# Patient Record
Sex: Female | Born: 1995 | Race: Black or African American | Hispanic: No | Marital: Single | Smoking: Former smoker
Health system: Southern US, Community
[De-identification: ages and names within clinical notes are randomized; demographics above are authoritative.]

## PROBLEM LIST (undated history)

## (undated) DIAGNOSIS — O1490 Unspecified pre-eclampsia, unspecified trimester: Secondary | ICD-10-CM

## (undated) HISTORY — PX: NO PAST SURGERIES: SHX2092

## (undated) HISTORY — DX: Unspecified pre-eclampsia, unspecified trimester: O14.90

---
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
## 2016-03-02 ENCOUNTER — Emergency Department (HOSPITAL_COMMUNITY): Admission: EM | Admit: 2016-03-02 | Discharge: 2016-03-02 | Discharge status: Against Medical Advice | Payer: Self-pay

## 2016-03-02 NOTE — ED Notes (Signed)
Called pt and unable to locate

## 2016-03-02 NOTE — ED Notes (Signed)
Pt. Came to the desk and stated, I have to go , I will come back later."  Encouraged pt. To stay, she stated, " I will come back when it is not busy."

## 2017-07-17 ENCOUNTER — Emergency Department (HOSPITAL_COMMUNITY)
Admission: EM | Admit: 2017-07-17 | Discharge: 2017-07-17 | Disposition: A | Discharge status: Home / Self Care | Payer: Medicaid Other | Attending: Emergency Medicine | Admitting: Emergency Medicine

## 2017-07-17 ENCOUNTER — Emergency Department (HOSPITAL_COMMUNITY): Payer: Medicaid Other

## 2017-07-17 ENCOUNTER — Encounter (HOSPITAL_COMMUNITY): Payer: Self-pay | Admitting: Emergency Medicine

## 2017-07-17 DIAGNOSIS — O468X1 Other antepartum hemorrhage, first trimester: Secondary | ICD-10-CM | POA: Insufficient documentation

## 2017-07-17 DIAGNOSIS — O4690 Antepartum hemorrhage, unspecified, unspecified trimester: Secondary | ICD-10-CM

## 2017-07-17 DIAGNOSIS — O418X11 Other specified disorders of amniotic fluid and membranes, first trimester, fetus 1: Secondary | ICD-10-CM | POA: Insufficient documentation

## 2017-07-17 DIAGNOSIS — Z87891 Personal history of nicotine dependence: Secondary | ICD-10-CM | POA: Insufficient documentation

## 2017-07-17 DIAGNOSIS — Z3A08 8 weeks gestation of pregnancy: Secondary | ICD-10-CM | POA: Insufficient documentation

## 2017-07-17 DIAGNOSIS — O418X10 Other specified disorders of amniotic fluid and membranes, first trimester, not applicable or unspecified: Secondary | ICD-10-CM

## 2017-07-17 DIAGNOSIS — O208 Other hemorrhage in early pregnancy: Secondary | ICD-10-CM | POA: Diagnosis present

## 2017-07-17 LAB — URINALYSIS, ROUTINE W REFLEX MICROSCOPIC
Bilirubin Urine: NEGATIVE
GLUCOSE, UA: NEGATIVE mg/dL
HGB URINE DIPSTICK: NEGATIVE
Ketones, ur: NEGATIVE mg/dL
LEUKOCYTES UA: NEGATIVE
Nitrite: NEGATIVE
PH: 7 (ref 5.0–8.0)
Protein, ur: NEGATIVE mg/dL
Specific Gravity, Urine: 1.021 (ref 1.005–1.030)

## 2017-07-17 LAB — HCG, QUANTITATIVE, PREGNANCY: hCG, Beta Chain, Quant, S: 127852 m[IU]/mL — ABNORMAL HIGH (ref <5)

## 2017-07-17 LAB — COMPREHENSIVE METABOLIC PANEL
ALK PHOS: 57 U/L (ref 38–126)
ALT: 35 U/L (ref 14–54)
ANION GAP: 9 (ref 5–15)
AST: 26 U/L (ref 15–41)
Albumin: 4.1 g/dL (ref 3.5–5.0)
BILIRUBIN TOTAL: 0.5 mg/dL (ref 0.3–1.2)
BUN: 10 mg/dL (ref 6–20)
CALCIUM: 9.6 mg/dL (ref 8.9–10.3)
CO2: 22 mmol/L (ref 22–32)
CREATININE: 0.51 mg/dL (ref 0.44–1.00)
Chloride: 107 mmol/L (ref 101–111)
GFR calc non Af Amer: >60 mL/min (ref >60)
Glucose, Bld: 92 mg/dL (ref 65–99)
Potassium: 3.3 mmol/L — ABNORMAL LOW (ref 3.5–5.1)
SODIUM: 138 mmol/L (ref 135–145)
TOTAL PROTEIN: 7.3 g/dL (ref 6.5–8.1)

## 2017-07-17 LAB — CBC
HEMATOCRIT: 41.2 % (ref 36.0–46.0)
HEMOGLOBIN: 14 g/dL (ref 12.0–15.0)
MCH: 28.2 pg (ref 26.0–34.0)
MCHC: 34 g/dL (ref 30.0–36.0)
MCV: 83.1 fL (ref 78.0–100.0)
Platelets: 235 10*3/uL (ref 150–400)
RBC: 4.96 MIL/uL (ref 3.87–5.11)
RDW: 13.4 % (ref 11.5–15.5)
WBC: 5.6 10*3/uL (ref 4.0–10.5)

## 2017-07-17 LAB — WET PREP, GENITAL
CLUE CELLS WET PREP: NONE SEEN
SPERM: NONE SEEN
Trich, Wet Prep: NONE SEEN
Yeast Wet Prep HPF POC: NONE SEEN

## 2017-07-17 LAB — ABO/RH: ABO/RH(D): O POS

## 2017-07-17 NOTE — ED Triage Notes (Signed)
Pt states she is [redacted] weeks pregnant and woke up this morning and when she wiped she had some pink fluid on the tissue and when she wiped again she had some bright red blood on the tissue  Pt states she has some mild cramping but denies pain

## 2017-07-17 NOTE — Discharge Instructions (Signed)
Please read attached information regarding your condition. Follow pelvic rest instructions as attached. Follow-up with women's health clinic for further evaluation. Return to ED for severe abdominal pain, increased bleeding, urinary symptoms, vaginal discharge, increased vomiting.

## 2017-07-17 NOTE — ED Notes (Signed)
Ultrasound at the bedside

## 2017-07-17 NOTE — ED Provider Notes (Signed)
[facility]-EMERGENCY DEPT Provider Note   CSN: [id number] Arrival date & time: 07/17/17  6962     History   Chief Complaint Chief Complaint  Patient presents with  . Vaginal Bleeding    HPI Wanda Porter is a 21 y.o. female.  HPI  Patient, who is [redacted]wks pregnant, presents to ED for evaluation of vaginal bleeding prior to arrival. She reports pink "fluidy" discharge and bright red blood while wiping. She has since then been using a tampon but has not checked if bleeding has continued. She reports intermittent abdominal cramping since beginning of pregnancy. She reports Being seen by her OB/GYN approximately 5 days ago for checkup. She reports she was told that she had a normal intrauterine pregnancy and baby had a heart rate of 140s at [facility]. Reports some nausea and occasional vomiting. She denies any chest pain, SOB, hemoptysis, constipation, diarrhea, vaginal discharge, STD exposure. She reports that this is her second pregnancy. She states that her first pregnancy was a few years ago and she received an abortion due to her young age.  History reviewed. No pertinent past medical history.  There are no active problems to display for this patient.   History reviewed. No pertinent surgical history.  OB History    Gravida Para Term Preterm AB Living   1             SAB TAB Ectopic Multiple Live Births                   Home Medications    Prior to Admission medications   Medication Sig Start Date End Date Taking? Authorizing Provider  Prenatal Vit-Fe Fumarate-FA (MULTIVITAMIN-PRENATAL) 27-0.8 MG TABS tablet Take 1 tablet by mouth daily at 12 noon.   Yes [provider]    Family History Family History  Problem Relation Age of Onset  . Diabetes Other   . Hypertension Other     Social History Social History  Substance Use Topics  . Smoking status: Former Games developer  . Smokeless tobacco: Never Used  . Alcohol use No     Allergies   Patient has no known  allergies.   Review of Systems Review of Systems  Constitutional: Negative for appetite change, chills and fever.  HENT: Negative for ear pain, rhinorrhea, sneezing and sore throat.   Eyes: Negative for photophobia and visual disturbance.  Respiratory: Negative for cough, chest tightness, shortness of breath and wheezing.   Cardiovascular: Negative for chest pain and palpitations.  Gastrointestinal: Positive for abdominal pain (cramping), nausea and vomiting. Negative for blood in stool, constipation and diarrhea.  Genitourinary: Positive for vaginal bleeding. Negative for dysuria, hematuria and urgency.  Musculoskeletal: Negative for myalgias.  Skin: Negative for rash.  Neurological: Negative for dizziness, weakness and light-headedness.     Physical Exam Updated Vital Signs BP 112/76   Pulse 81   Temp 98.3 F (36.8 C) (Oral)   Resp 18   Ht 5' (1.524 m)   Wt 54.5 kg (120 lb 4 oz)   LMP 05/17/2017 (Exact Date)   SpO2 100%   BMI 23.48 kg/m   Physical Exam  Constitutional: She appears well-developed and well-nourished. No distress.  HENT:  Head: Normocephalic and atraumatic.  Nose: Nose normal.  Eyes: Conjunctivae and EOM are normal. Left eye exhibits no discharge. No scleral icterus.  Neck: Normal range of motion. Neck supple.  Cardiovascular: Regular rhythm, normal heart sounds and intact distal pulses.  Tachycardia present.  Exam reveals no gallop  and no friction rub.   No murmur heard. Pulmonary/Chest: Effort normal and breath sounds normal. No respiratory distress.  Abdominal: Soft. Bowel sounds are normal. She exhibits no distension. There is no tenderness. There is no guarding.  Genitourinary: There is no rash on the right labia. There is no rash on the left labia. Cervix exhibits no motion tenderness and no discharge. Right adnexum displays no tenderness. Left adnexum displays no tenderness. Vaginal discharge found.  Genitourinary Comments: Chaperone present for  entirety of GU exam. There is thin white vaginal discharge present in the vaginal vault. No bleeding noted in vault. Cervical os is closed. No cervical motion tenderness or adnexal tenderness noted.  Musculoskeletal: Normal range of motion. She exhibits no edema.  Neurological: She is alert. She exhibits normal muscle tone. Coordination normal.  Skin: Skin is warm and dry. No rash noted.  Psychiatric: She has a normal mood and affect.  Nursing note and vitals reviewed.    ED Treatments / Results  Labs (all labs ordered are listed, but only abnormal results are displayed) Labs Reviewed  WET PREP, GENITAL - Abnormal; Notable for the following:       Result Value   WBC, Wet Prep HPF POC RARE (*)    All other components within normal limits  HCG, QUANTITATIVE, PREGNANCY - Abnormal; Notable for the following:    hCG, Beta Francene FindersChain, Quant, S [id number] (*)    All other components within normal limits  COMPREHENSIVE METABOLIC PANEL - Abnormal; Notable for the following:    Potassium 3.3 (*)    All other components within normal limits  CBC  URINALYSIS, ROUTINE W REFLEX MICROSCOPIC  ABO/RH  GC/CHLAMYDIA PROBE AMP ([place]) NOT AT [facility]    EKG  EKG Interpretation None       Radiology Kore[place] Ob Comp < 14 Wks  Result Date: 07/17/2017 CLINICAL DATA:  Vaginal bleeding. EXAM: OBSTETRIC <14 WK US AND TRANSVAGINAL OB US TECHNIQUE: Both transabdominal and transvaginal ultrasound examinations were performed for complete evaluation of the gestation as well as the maternal uterus, adnexal regions, and pelvic cul-de-sac. Transvaginal technique was performed to assess early pregnancy. COMPARISON:  No recent prior . FINDINGS: Intrauterine gestational sac: Single Yolk sac:  Present Embryo:  Present Cardiac Activity: Present Heart Rate: 152  bpm MSD:   mm    w     d CRL:  2.0  mm   8 w   4 d                  US EDC: 02/22/2018 Subchorionic hemorrhage:  Small subchorionic hemorrhage noted. Maternal  uterus/adnexae: Right ovarian cyst with thin septations measuring up to 2.4 cm noted. These most likely benign. Trace free pelvic fluid. IMPRESSION: Single viable intrauterine pregnancy at 8 weeks 4 days. Small subchorionic hemorrhage noted. Electronically Signed   By: Maisie Fushomas  Register   On: 07/17/2017 08:33   [place] Ob Transvaginal  Result Date: 07/17/2017 CLINICAL DATA:  Vaginal bleeding. EXAM: OBSTETRIC <14 WK US AND TRANSVAGINAL OB US TECHNIQUE: Both transabdominal and transvaginal ultrasound examinations were performed for complete evaluation of the gestation as well as the maternal uterus, adnexal regions, and pelvic cul-de-sac. Transvaginal technique was performed to assess early pregnancy. COMPARISON:  No recent prior . FINDINGS: Intrauterine gestational sac: Single Yolk sac:  Present Embryo:  Present Cardiac Activity: Present Heart Rate: 152  bpm MSD:   mm    w     d CRL:  2.0  mm   8  w   4 d                  US EDC: 02/22/2018 Subchorionic hemorrhage:  Small subchorionic hemorrhage noted. Maternal uterus/adnexae: Right ovarian cyst with thin septations measuring up to 2.4 cm noted. These most likely benign. Trace free pelvic fluid. IMPRESSION: Single viable intrauterine pregnancy at 8 weeks 4 days. Small subchorionic hemorrhage noted. Electronically Signed   By: Maisie Fushomas  Register   On: 07/17/2017 08:33    Procedures Procedures (including critical care time)  Medications Ordered in ED Medications - No data to display   Initial Impression / Assessment and Plan / ED Course  I have reviewed the triage vital signs and the nursing notes.  Pertinent labs & imaging results that were available during my care of the patient were reviewed by me and considered in my medical decision making (see chart for details).     Patient presents to ED for evaluation of brief episode of vaginal bleeding prior to arrival. She states that she is [redacted] weeks pregnant and his [facility]  States that she received an ultrasound with a normal intrauterine pregnancy approximately 5 days ago. She denies any previous history of vaginal bleeding. She reports intermittent cramping that has been present for the past 8 weeks. CBC, CMP unremarkable. HCG is 127,000 here today. Blood type is O+, no need for mini GAM. Ultrasound showed normal intrauterine pregnancy at 8 weeks and 4 days. There is a small subchorionic hemorrhage noted, which could be the cause of her brief vaginal bleeding. Urinalysis unremarkable. Wet prep showed no evidence of BV, trichomoniasis or yeast. GC chlamydia sent. No history of exposure or appearance of discharge on pelvic exam. Cervical os closed. She is afebrile with no history of fever. She appears comfortable, with no tenderness to palpation of the abdomen. Patient advised to follow-up with women's clinic for further evaluation and management of her pregnancy and cramping. I informed her of her negative workup today and advised her with instructions given for pelvic rest including no tampons, no douching, no sexual activity, lifting <15lbs until cleared by OB. Also given information for subchorionic hemorrhage. Patient appears stable for discharge at this time. Strict return precautions given.  Final Clinical Impressions(s) / ED Diagnoses   Final diagnoses:  Vaginal bleeding in pregnancy  Subchorionic hematoma in first trimester, single or unspecified fetus    New Prescriptions New Prescriptions   No medications on file     Dietrich PatesKhatri, Tamarah Bhullar, Cordelia Poche-C 07/17/17 [id number]    Palumbo, April, MD 07/17/17 2329

## 2017-07-19 LAB — GC/CHLAMYDIA PROBE AMP (~~LOC~~) NOT AT ARMC
Chlamydia: NEGATIVE
Neisseria Gonorrhea: NEGATIVE

## 2018-12-17 NOTE — L&D Delivery Note (Addendum)
Delivery Note At 4:43 PM a viable  Healthy female was delivered via Vaginal, Spontaneous vaginal delivery.  APGAR: 8/9 ,  weight pending Placenta status:deliverd in-tact, spontaneously.  Cord: three-vessel without complications Anesthesia:  None Episiotomy:  none Lacerations:  1st degree vaginal, hemostatic Suture Repair: not needed Est. Blood Loss (mL):  235 mL  Mom to postpartum.  Baby to Couplet care / Skin to Skin.  Nikki Dom Driver 06/16/625, 9:48 PM  The above was performed under my direct supervision and guidance.

## 2018-12-31 LAB — OB RESULTS CONSOLE HGB/HCT, BLOOD
HCT: 40 (ref 29–41)
Hemoglobin: 12.7

## 2019-01-22 LAB — OB RESULTS CONSOLE HGB/HCT, BLOOD
HCT: 40 (ref 29–41)
Hemoglobin: 13.1

## 2019-01-22 LAB — OB RESULTS CONSOLE RPR: RPR: NONREACTIVE

## 2019-01-22 LAB — OB RESULTS CONSOLE GC/CHLAMYDIA
Chlamydia: NEGATIVE
Gonorrhea: NEGATIVE

## 2019-01-22 LAB — OB RESULTS CONSOLE ABO/RH

## 2019-01-22 LAB — OB RESULTS CONSOLE ANTIBODY SCREEN: Antibody Screen: NEGATIVE

## 2019-01-22 LAB — OB RESULTS CONSOLE RUBELLA ANTIBODY, IGM: Rubella: IMMUNE

## 2019-01-22 LAB — OB RESULTS CONSOLE HEPATITIS B SURFACE ANTIGEN: Hepatitis B Surface Ag: NEGATIVE

## 2019-01-22 LAB — OB RESULTS CONSOLE HIV ANTIBODY (ROUTINE TESTING): HIV: NONREACTIVE

## 2019-02-22 LAB — OB RESULTS CONSOLE HGB/HCT, BLOOD
HCT: 43 — AB (ref 29–41)
Hemoglobin: 13.9

## 2019-02-22 LAB — OB RESULTS CONSOLE ABO/RH: RH Type: POSITIVE

## 2019-02-27 LAB — OB RESULTS CONSOLE HGB/HCT, BLOOD
HCT: 41 (ref 29–41)
Hemoglobin: 13.3

## 2019-05-14 LAB — GLUCOSE, 1 HOUR: Glucose 1 Hour: 75

## 2019-06-12 ENCOUNTER — Other Ambulatory Visit: Payer: Self-pay | Admitting: Internal Medicine

## 2019-06-12 ENCOUNTER — Other Ambulatory Visit: Payer: Self-pay | Admitting: *Deleted

## 2019-06-12 DIAGNOSIS — Z20822 Contact with and (suspected) exposure to covid-19: Secondary | ICD-10-CM

## 2019-06-17 ENCOUNTER — Ambulatory Visit: Payer: Self-pay | Admitting: *Deleted

## 2019-06-17 NOTE — Telephone Encounter (Signed)
Pt called In requesting the results of her COVID-19 test. I let her know that the result was not ready.   I let her know they are processing a lot of COVID-19 tests so it may take a day or two longer to get results. I noticed she is active on Mychart.  I let her know her results would come into her Mychart account when they were ready.   She would know her results faster via Mychart than waiting on someone to call the results to her, most likely.   She was agreeable to watching for results via Mychart.

## 2019-06-18 LAB — NOVEL CORONAVIRUS, NAA: SARS-CoV-2, NAA: NOT DETECTED

## 2019-06-23 ENCOUNTER — Encounter: Payer: Self-pay | Admitting: *Deleted

## 2019-06-24 ENCOUNTER — Encounter: Payer: Self-pay | Admitting: *Deleted

## 2019-06-24 DIAGNOSIS — O09299 Supervision of pregnancy with other poor reproductive or obstetric history, unspecified trimester: Secondary | ICD-10-CM

## 2019-06-24 DIAGNOSIS — Z789 Other specified health status: Secondary | ICD-10-CM | POA: Insufficient documentation

## 2019-06-25 ENCOUNTER — Other Ambulatory Visit: Payer: Self-pay

## 2019-06-25 ENCOUNTER — Telehealth: Payer: Self-pay | Admitting: General Practice

## 2019-06-25 ENCOUNTER — Telehealth (INDEPENDENT_AMBULATORY_CARE_PROVIDER_SITE_OTHER): Payer: Medicaid Other | Admitting: Internal Medicine

## 2019-06-25 ENCOUNTER — Telehealth: Payer: Self-pay | Admitting: Obstetrics & Gynecology

## 2019-06-25 VITALS — BP 116/68 | HR 96 | Wt 140.0 lb

## 2019-06-25 DIAGNOSIS — O368390 Maternal care for abnormalities of the fetal heart rate or rhythm, unspecified trimester, not applicable or unspecified: Secondary | ICD-10-CM

## 2019-06-25 DIAGNOSIS — O09293 Supervision of pregnancy with other poor reproductive or obstetric history, third trimester: Secondary | ICD-10-CM

## 2019-06-25 DIAGNOSIS — O368330 Maternal care for abnormalities of the fetal heart rate or rhythm, third trimester, not applicable or unspecified: Secondary | ICD-10-CM

## 2019-06-25 DIAGNOSIS — Z3490 Encounter for supervision of normal pregnancy, unspecified, unspecified trimester: Secondary | ICD-10-CM | POA: Insufficient documentation

## 2019-06-25 DIAGNOSIS — Z789 Other specified health status: Secondary | ICD-10-CM

## 2019-06-25 DIAGNOSIS — O09299 Supervision of pregnancy with other poor reproductive or obstetric history, unspecified trimester: Secondary | ICD-10-CM

## 2019-06-25 DIAGNOSIS — Z3A30 30 weeks gestation of pregnancy: Secondary | ICD-10-CM

## 2019-06-25 DIAGNOSIS — Z3493 Encounter for supervision of normal pregnancy, unspecified, third trimester: Secondary | ICD-10-CM

## 2019-06-25 HISTORY — DX: Encounter for supervision of normal pregnancy, unspecified, unspecified trimester: Z34.90

## 2019-06-25 MED ORDER — AMBULATORY NON FORMULARY MEDICATION: 1.0000 | Freq: Once | 0 refills | Status: AC

## 2019-06-25 NOTE — Progress Notes (Signed)
I connected with  [name] on 06/25/19 at  2:55 PM EDT by telephone and verified that I am speaking with the correct person using two identifiers.   I discussed the limitations, risks, security and privacy concerns of performing an evaluation and management service by telephone and the availability of in person appointments. I also discussed with the patient that there may be a patient responsible charge related to this service. The patient expressed understanding and agreed to proceed.  [place], [place] 06/25/2019  2:42 PM   Will call patient back at 255pm @appointmet  time. She stated she was at work and would be ready at her appointment time.  Needs BP Cuff

## 2019-06-25 NOTE — Telephone Encounter (Signed)
Called patient and told her Dr Ihor Dow talked with one of our doctors in the office, Dr Juleen China, and she has stated she would see you physically in our office this afternoon. Patient verbalized understanding and states what is the latest she can come. Told patient 415. Patient verbalized understanding. Apologized to patient for confusion in the appt and asked if it was explained why her appt was virtual. Patient states no. Told patient I think it was because the appt was made when her covid test was pending and the results weren't back yet. Patient states no that the appt was made before all that. Apologized to patient and stated Dr Juleen China will see her in the office this afternoon. Patient verbalized understanding & states she will be here soon.

## 2019-06-25 NOTE — Progress Notes (Signed)
Scheduled referral to Dr Jonna Clark office 7/16 @8am 

## 2019-06-25 NOTE — Progress Notes (Signed)
Subjective:   Wanda Porter is a 23 y.o. G3P1011 at 7252w3d by LMP, early ultrasound being seen today for her first obstetrical visit. Transfer from [place]. Her obstetrical history is significant for pre-eclampsia in prior pregnancy. . Patient does intend to breast feed. Pregnancy history fully reviewed.  Patient reports no complaints.  HISTORY: OB History  Gravida Para Term Preterm AB Living  3 1 1  0 1 1  SAB TAB Ectopic Multiple Live Births  0 1 0 0 1    # Outcome Date GA Lbr Len/2nd Weight Sex Delivery Anes PTL Lv  3 Current           2 Term 02/10/18 7182w3d  2.67 kg M Vag-Spont EPI  LIV  1 TAB 2012           Past Medical History:  Diagnosis Date  . Pre-eclampsia    No past surgical history on file. Family History  Problem Relation Age of Onset  . Diabetes Other   . Hypertension Other    Social History   Tobacco Use  . Smoking status: Current Every Day Smoker  . Smokeless tobacco: Never Used  Substance Use Topics  . Alcohol use: No  . Drug use: No   No Known Allergies Current Outpatient Medications on File Prior to Visit  Medication Sig Dispense Refill  . Prenatal Vit-Fe Fumarate-FA (MULTIVITAMIN-PRENATAL) 27-0.8 MG TABS tablet Take 1 tablet by mouth daily at 12 noon.     No current facility-administered medications on file prior to visit.     Exam   Vitals:   06/25/19 1444  BP: 116/68  Pulse: 96  Weight: 63.5 kg   Fetal Heart Rate (bpm): 139  Uterus:   Gravid, consistent with dates   Pelvic Exam: Perineum: no hemorrhoids, normal perineum   Vulva: normal external genitalia, no lesions   Vagina:  normal mucosa, normal discharge   Cervix: no lesions and normal, pap smear done.    Adnexa: normal adnexa and no mass, fullness, tenderness   Bony Pelvis: average  System: General: well-developed, well-nourished female in no acute distress   Breast:  normal appearance, no masses or tenderness   Skin: normal coloration and turgor, no rashes   Neurologic: oriented, normal, negative, normal mood   Extremities: normal strength, tone, and muscle mass, ROM of all joints is normal   HEENT PERRLA, extraocular movement intact and sclera clear, anicteric   Mouth/Teeth mucous membranes moist, pharynx normal without lesions and dental hygiene good   Neck supple and no masses   Cardiovascular: regular rate and rhythm   Respiratory:  no respiratory distress, normal breath sounds   Abdomen: soft, non-tender; bowel sounds normal; no masses,  no organomegaly     Assessment:   Pregnancy: [id number] Patient Active Problem List   Diagnosis Date Noted  . Irregular fetal heart rate 07/04/2019  . Supervision of low-risk pregnancy 06/25/2019  . History of pre-eclampsia in prior pregnancy, currently pregnant 06/24/2019  . Patient's reproductive partner has sickle cell trait 06/24/2019     Plan:    1. Patient's reproductive partner has sickle cell trait  2. History of pre-eclampsia in prior pregnancy, currently pregnant BP is within normal limits. Patient asymptomatic. Continue ASA therapy.  - AMBULATORY NON FORMULARY MEDICATION; 1 Device by Other route once for 1 dose. BP cuff size medium to monitor BP at home weekly. icd 10 dx z34.9 low risk  Dispense: 1 Device; Refill: 0  3. Encounter for supervision of low-risk pregnancy in  third trimester - AMBULATORY NON FORMULARY MEDICATION; 1 Device by Other route once for 1 dose. BP cuff size medium to monitor BP at home weekly. icd 10 dx z34.9 low risk  Dispense: 1 Device; Refill: 0 - [facility] AMB BABYSCRIPTS SCHEDULE OPTIMIZATION  4. Irregular fetal heart rate affecting management of mother Noted to have wide discrepancy in fetal HR, varying >15 bpm during doppler. Audible skipped beats by provider and RN. Mother reports other child has some type of heart defect as does FOB. She is unsure what type of defect, just knows they have heart murmurs.  - US Fetal Echocardiography; Future   Melina Schools  8:12 PM 07/04/19

## 2019-06-25 NOTE — Telephone Encounter (Signed)
Attempted to call patient about her appointment on 7/9 @ 2:55. No answer, left voicemail instructing patient that the visit is a mychart appointment. Patient instructed to download the mychart if not already done so. Patient instructed to give the office a call if needing assistance

## 2019-06-29 ENCOUNTER — Encounter: Payer: Self-pay | Admitting: Family Medicine

## 2019-07-04 DIAGNOSIS — O368390 Maternal care for abnormalities of the fetal heart rate or rhythm, unspecified trimester, not applicable or unspecified: Secondary | ICD-10-CM

## 2019-07-04 HISTORY — DX: Maternal care for abnormalities of the fetal heart rate or rhythm, unspecified trimester, not applicable or unspecified: O36.8390

## 2019-07-06 ENCOUNTER — Telehealth (INDEPENDENT_AMBULATORY_CARE_PROVIDER_SITE_OTHER): Payer: Medicaid Other

## 2019-07-06 DIAGNOSIS — Z560 Unemployment, unspecified: Secondary | ICD-10-CM

## 2019-07-06 NOTE — Telephone Encounter (Signed)
Called pt in regards to her MyChart message and pt informed me that she has short term diability and then her FMLA will kick in.  Pt reports that she is on her feet 8 hrs a day and her whole body aches.  Pt c/o headaches that are not relieved with Tylenol.  I asked pt if she is able to check her BP.  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  I explained to the pt Nira Conn is not back into the office until 07/13/19 and if it was okay that she waits until then so that I can speak her about taking her out of work.  Pt stated that it would be fine and that I could LM on VM or send MyChart message if she does not pick up.  I also asked pt to give me until the end of day on 07/13/19 until she hears back from me.  Pt agreed.

## 2019-07-07 ENCOUNTER — Telehealth: Payer: Self-pay | Admitting: Student

## 2019-07-07 NOTE — Telephone Encounter (Signed)
The patient stated she would like a call back from nurse Morey Hummingbird to discuss previous mychart messages.

## 2019-07-07 NOTE — Telephone Encounter (Signed)
Per Morey Hummingbird, RN there was a confusion on the time that the pt wanted to take FMLA.  I called pt and informed pt that I apologized for the miscommunication about her FMLA.  I informed pt that I we will continue with plan of speaking with Marcille Buffy on 07/13/19 about taking her out of work now.  Pt agreed.

## 2019-07-09 ENCOUNTER — Telehealth: Payer: Medicaid Other | Admitting: Obstetrics and Gynecology

## 2019-07-10 ENCOUNTER — Inpatient Hospital Stay
Admission: RE | Admit: 2019-07-10 | Discharge: 2019-07-10 | Disposition: A | Discharge status: Home / Self Care | Payer: Medicaid Other | Source: Ambulatory Visit

## 2019-07-10 ENCOUNTER — Telehealth: Payer: Medicaid Other

## 2019-07-13 ENCOUNTER — Other Ambulatory Visit: Payer: Self-pay

## 2019-07-13 ENCOUNTER — Encounter: Payer: Self-pay | Admitting: Advanced Practice Midwife

## 2019-07-13 ENCOUNTER — Telehealth (INDEPENDENT_AMBULATORY_CARE_PROVIDER_SITE_OTHER): Payer: Medicaid Other | Admitting: Advanced Practice Midwife

## 2019-07-13 DIAGNOSIS — Z3493 Encounter for supervision of normal pregnancy, unspecified, third trimester: Secondary | ICD-10-CM | POA: Diagnosis not present

## 2019-07-13 DIAGNOSIS — Z3490 Encounter for supervision of normal pregnancy, unspecified, unspecified trimester: Secondary | ICD-10-CM

## 2019-07-13 DIAGNOSIS — Z3A33 33 weeks gestation of pregnancy: Secondary | ICD-10-CM | POA: Diagnosis not present

## 2019-07-13 NOTE — Progress Notes (Signed)
I connected with  Wanda Porter on 07/13/19 at 11:15 AM EDT by telephone and verified that I am speaking with the correct person using two identifiers.   I discussed the limitations, risks, security and privacy concerns of performing an evaluation and management service by telephone and virtually and the availability of in person appointments. I also discussed with the patient that there may be a patient responsible charge related to this service. The patient expressed understanding and agreed to proceed.  She states she has not received the blood pressure cuff; I informed her I will follow upon getting it sent to her. She reports sometimes she feels like it is up ; like when she had preeclampsia in previous pregnancy. She c/o headache at times. I advised her if she has a severe headache unrelieved by tylenol to go to MAU to get evaluated. She voices understanding.   Ermine Spofford,RN 07/13/2019  11:07 AM

## 2019-07-13 NOTE — Progress Notes (Signed)
   TELEHEALTH VIRTUAL OBSTETRICS VISIT ENCOUNTER NOTE  I connected with Wanda Porter on 07/13/19 at 11:15 AM EDT by telephone at home and verified that I am speaking with the correct person using two identifiers.   I discussed the limitations, risks, security and privacy concerns of performing an evaluation and management service by telephone and the availability of in person appointments. I also discussed with the patient that there may be a patient responsible charge related to this service. The patient expressed understanding and agreed to proceed.  Subjective:  Wanda Porter is a 23 y.o. G3P1011 at [redacted]w[redacted]d being followed for ongoing prenatal care.  She is currently monitored for the following issues for this low-risk pregnancy and has History of pre-eclampsia in prior pregnancy, currently pregnant; Patient's reproductive partner has sickle cell trait; Supervision of low-risk pregnancy; and Irregular fetal heart rate on their problem list.  Patient reports no complaints. Reports fetal movement. Denies any contractions, bleeding or leaking of fluid.   The following portions of the patient's history were reviewed and updated as appropriate: allergies, current medications, past family history, past medical history, past social history, past surgical history and problem list.   Objective:   General:  Alert, oriented and cooperative.   Mental Status: Normal mood and affect perceived. Normal judgment and thought content.  Rest of physical exam deferred due to type of encounter  Assessment and Plan:  Pregnancy: G3P1011 at [redacted]w[redacted]d 1. Encounter for supervision of low-risk pregnancy, antepartum - Patient has not been able to get a BP cuff. No headache today, but headaches off and on. Will have patient return to the office for a BP check, and so we can assist her with getting a BP cuff. Advised patient to come to MAU if she develops a headache that doesn't resolved.   Preterm labor symptoms and general  obstetric precautions including but not limited to vaginal bleeding, contractions, leaking of fluid and fetal movement were reviewed in detail with the patient.  I discussed the assessment and treatment plan with the patient. The patient was provided an opportunity to ask questions and all were answered. The patient agreed with the plan and demonstrated an understanding of the instructions. The patient was advised to call back or seek an in-person office evaluation/go to MAU at [facility] for any urgent or concerning symptoms. Please refer to After Visit Summary for other counseling recommendations.   I provided 15 minutes of non-face-to-face time during this encounter.  Return in about 2 weeks (around 07/27/2019) for virtual visit  but also needs a blood pressure check in the office in the next 1-2 days .  No future appointments.  Marcille Buffy DNP, CNM  07/13/19  11:38 AM  [facility]

## 2019-07-15 NOTE — Telephone Encounter (Signed)
Spoke with Marcille Buffy, CNM who agreed to take pt out of work as of now.  Called pt and pt informed me that she is no longer working for the company because of [place] so the letter will not be necessary.  Pt asked about her appts.  Per chart review was suppose to come in 1-2 days from her visit on 07/13/19 for BP check. I asked pt since it has been delayed could she come tomorrow for BP check.  Pt stated that she would be able to come in tomorrow but unsure what time.  I asked pt to please come in before 11 am due our office closing at noon.  Pt then asked when is her provider appt.  I explained to the pt that I will have that scheduled for her by tomorrow so that it will print on her AVS sheet tomorrow.   Pt verbalized understanding.

## 2019-07-19 ENCOUNTER — Encounter (HOSPITAL_COMMUNITY): Payer: Self-pay

## 2019-07-19 ENCOUNTER — Other Ambulatory Visit: Payer: Self-pay

## 2019-07-19 ENCOUNTER — Observation Stay (HOSPITAL_COMMUNITY)
Admission: AD | Admit: 2019-07-19 | Discharge: 2019-07-20 | Disposition: A | Discharge status: Home / Self Care | Payer: Medicaid Other | Attending: Obstetrics & Gynecology | Admitting: Obstetrics & Gynecology

## 2019-07-19 DIAGNOSIS — O4703 False labor before 37 completed weeks of gestation, third trimester: Secondary | ICD-10-CM | POA: Diagnosis not present

## 2019-07-19 DIAGNOSIS — N760 Acute vaginitis: Secondary | ICD-10-CM | POA: Diagnosis not present

## 2019-07-19 DIAGNOSIS — O99333 Smoking (tobacco) complicating pregnancy, third trimester: Secondary | ICD-10-CM | POA: Diagnosis not present

## 2019-07-19 DIAGNOSIS — Z20828 Contact with and (suspected) exposure to other viral communicable diseases: Secondary | ICD-10-CM | POA: Insufficient documentation

## 2019-07-19 DIAGNOSIS — B9689 Other specified bacterial agents as the cause of diseases classified elsewhere: Secondary | ICD-10-CM | POA: Diagnosis not present

## 2019-07-19 DIAGNOSIS — O479 False labor, unspecified: Secondary | ICD-10-CM

## 2019-07-19 DIAGNOSIS — Z3A33 33 weeks gestation of pregnancy: Secondary | ICD-10-CM | POA: Insufficient documentation

## 2019-07-19 DIAGNOSIS — O4700 False labor before 37 completed weeks of gestation, unspecified trimester: Secondary | ICD-10-CM

## 2019-07-19 DIAGNOSIS — O98813 Other maternal infectious and parasitic diseases complicating pregnancy, third trimester: Secondary | ICD-10-CM | POA: Diagnosis not present

## 2019-07-19 MED ORDER — SODIUM CHLORIDE 0.9 % IV SOLN
Freq: Once | INTRAVENOUS | Status: AC
  Administered 2019-07-19: 23:00:00 via INTRAVENOUS

## 2019-07-19 NOTE — ED Notes (Signed)
[facility] arrived for transport 

## 2019-07-19 NOTE — ED Notes (Signed)
[facility] dispatch notified for need of transport.  

## 2019-07-19 NOTE — ED Notes (Addendum)
[facility] contacted for transport to [facility]U for monitoring. Pt. Agrees to transport.

## 2019-07-19 NOTE — ED Triage Notes (Signed)
Pt arrived stating that she feels as though she is having contractions since 0700 today. Pt denies any vaginal leaking, states she had a large slimy discharge earlier this afternoon. Pt estimates contractions roughly 5 minutes apart.

## 2019-07-19 NOTE — ED Notes (Signed)
Pt. G3P1 [redacted]w[redacted]d presents to [facility] ED for lower back and abdominal pain since this morning. Pt. States she first thought they were braxton hicks ctx, but got stronger throughtout the day. Pt. Denies vaginal bleeding or leaking of fluids. Now rating ctx 8/10. Pt. Ctx 2-5 min apart. Pt. Able to talk through ctx. FHR Category I tracing. Pt. Hx of Pre-e with last pregnancy and had a term vaginal delivery. Spoke with OB provider Dr. Harolyn Rutherford about patient and she wants patient to MAU to rule out pre-term labor. ED provider [facility] notified.

## 2019-07-19 NOTE — ED Provider Notes (Signed)
[facility]-EMERGENCY DEPT Provider Note   CSN: [id number] Arrival date & time: 07/19/19  2212    History   Chief Complaint Chief Complaint  Patient presents with  . Contractions    HPI Wanda Porter is a 23 y.o. female.     Wanda Porter is a 23 y.o. G3P1011 at 722w6d who presents to the ED for c/o contractions. Reports the contractions to be mostly in her back. They began at 0700 today and initially thought them to be [employer]xton Hicks contractions. They have become stronger throughout the day. Had passage of a "slimy discharge" between 1500-1600 today. Currently believes contractions are 5 minutes apart. She has not had any fevers, N/V, dysuria, vaginal bleeding. [facility]     Past Medical History:  Diagnosis Date  . Pre-eclampsia     Patient Active Problem List   Diagnosis Date Noted  . Irregular fetal heart rate 07/04/2019  . Supervision of low-risk pregnancy 06/25/2019  . History of pre-eclampsia in prior pregnancy, currently pregnant 06/24/2019  . Patient's reproductive partner has sickle cell trait 06/24/2019    History reviewed. No pertinent surgical history.   OB History    Gravida  3   Para  1   Term  1   Preterm      AB  1   Living  1     SAB      TAB  1   Ectopic      Multiple  0   Live Births  1            Home Medications    Prior to Admission medications   Medication Sig Start Date End Date Taking? Authorizing Provider  Prenatal Vit-Fe Fumarate-FA (MULTIVITAMIN-PRENATAL) 27-0.8 MG TABS tablet Take 1 tablet by mouth daily at 12 noon.    [provider]    Family History Family History  Problem Relation Age of Onset  . Diabetes Other   . Hypertension Other     Social History Social History   Tobacco Use  . Smoking status: Current Every Day Smoker  . Smokeless tobacco: Never Used  Substance Use Topics  . Alcohol use: No  . Drug use: No     Allergies   Patient has no  known allergies.   Review of Systems Review of Systems Ten systems reviewed and are negative for acute change, except as noted in the HPI.    Physical Exam Updated Vital Signs BP 120/77   Pulse 95   Temp 98 F (36.7 C) (Oral)   Resp 17   LMP 11/24/2018 (Exact Date)   SpO2 99%   Physical Exam Vitals signs and nursing note reviewed.  Constitutional:      General: She is not in acute distress.    Appearance: She is well-developed. She is not diaphoretic.     Comments: Nontoxic appearing and in NAD  HENT:     Head: Normocephalic and atraumatic.  Eyes:     General: No scleral icterus.    Conjunctiva/sclera: Conjunctivae normal.  Neck:     Musculoskeletal: Normal range of motion.  Cardiovascular:     Rate and Rhythm: Regular rhythm. Tachycardia present.     Pulses: Normal pulses.  Pulmonary:     Effort: Pulmonary effort is normal. No respiratory distress.  Abdominal:     Comments: Gravid abdomen  Genitourinary:    Comments: Deferred to rapid OB Musculoskeletal: Normal range of motion.  Skin:    General: Skin is  warm and dry.     Coloration: Skin is not pale.     Findings: No erythema or rash.  Neurological:     Mental Status: She is alert and oriented to person, place, and time.  Psychiatric:        Behavior: Behavior normal.      ED Treatments / Results  Labs (all labs ordered are listed, but only abnormal results are displayed) Labs Reviewed  SARS CORONAVIRUS 2    EKG None  Radiology No results found.  Procedures Procedures (including critical care time)  Medications Ordered in ED Medications  0.9 %  sodium chloride infusion ( Intravenous New Bag/Given 07/19/19 2241)     10:28 PM Rapid OB called by nursing.   Initial Impression / Assessment and Plan / ED Course  I have reviewed the triage vital signs and the nursing notes.  Pertinent labs & imaging results that were available during my care of the patient were reviewed by me and considered  in my medical decision making (see chart for details).        23 year old female presents to the emergency department for contractions.  She is currently [redacted]w[redacted]d pregnant, followed by [employer]. Assessed at bedside by RR OB RN. Patient currently 2cm dilated. She will be transferred to MAU for further evaluation.  Vitals:   07/19/19 2218 07/19/19 2230 07/19/19 2245 07/19/19 2300  BP: 113/86 122/74  120/77  Pulse: (!) 130 99 96 95  Resp: 18   17  Temp: 98 F (36.7 C)     TempSrc: Oral     SpO2: 100% 96% 99% 99%    Final Clinical Impressions(s) / ED Diagnoses   Final diagnoses:  Preterm contractions    ED Discharge Orders    None       Antonietta Breach, PA-C 07/19/19 2310    Julianne Rice, MD 07/24/19 [phone]

## 2019-07-20 ENCOUNTER — Encounter (HOSPITAL_COMMUNITY): Payer: Self-pay

## 2019-07-20 ENCOUNTER — Ambulatory Visit (INDEPENDENT_AMBULATORY_CARE_PROVIDER_SITE_OTHER): Payer: Medicaid Other | Admitting: Obstetrics & Gynecology

## 2019-07-20 ENCOUNTER — Ambulatory Visit: Payer: Medicaid Other

## 2019-07-20 VITALS — BP 131/71 | HR 108 | Wt 144.0 lb

## 2019-07-20 DIAGNOSIS — O4703 False labor before 37 completed weeks of gestation, third trimester: Secondary | ICD-10-CM | POA: Diagnosis not present

## 2019-07-20 DIAGNOSIS — Z3490 Encounter for supervision of normal pregnancy, unspecified, unspecified trimester: Secondary | ICD-10-CM

## 2019-07-20 DIAGNOSIS — Z3A34 34 weeks gestation of pregnancy: Secondary | ICD-10-CM | POA: Diagnosis not present

## 2019-07-20 DIAGNOSIS — O98813 Other maternal infectious and parasitic diseases complicating pregnancy, third trimester: Secondary | ICD-10-CM

## 2019-07-20 DIAGNOSIS — O09299 Supervision of pregnancy with other poor reproductive or obstetric history, unspecified trimester: Secondary | ICD-10-CM

## 2019-07-20 DIAGNOSIS — Z3A33 33 weeks gestation of pregnancy: Secondary | ICD-10-CM

## 2019-07-20 DIAGNOSIS — B9689 Other specified bacterial agents as the cause of diseases classified elsewhere: Secondary | ICD-10-CM

## 2019-07-20 DIAGNOSIS — O368130 Decreased fetal movements, third trimester, not applicable or unspecified: Secondary | ICD-10-CM

## 2019-07-20 DIAGNOSIS — O09293 Supervision of pregnancy with other poor reproductive or obstetric history, third trimester: Secondary | ICD-10-CM

## 2019-07-20 DIAGNOSIS — N760 Acute vaginitis: Secondary | ICD-10-CM

## 2019-07-20 LAB — URINALYSIS, ROUTINE W REFLEX MICROSCOPIC
Bilirubin Urine: NEGATIVE
Glucose, UA: NEGATIVE mg/dL
Hgb urine dipstick: NEGATIVE
Ketones, ur: 20 mg/dL — AB
Leukocytes,Ua: NEGATIVE
Nitrite: NEGATIVE
Protein, ur: NEGATIVE mg/dL
Specific Gravity, Urine: 1.027 (ref 1.005–1.030)
pH: 6 (ref 5.0–8.0)

## 2019-07-20 LAB — WET PREP, GENITAL
Sperm: NONE SEEN
Trich, Wet Prep: NONE SEEN
Yeast Wet Prep HPF POC: NONE SEEN

## 2019-07-20 LAB — SARS CORONAVIRUS 2 (TAT 6-24 HRS): SARS Coronavirus 2: NEGATIVE

## 2019-07-20 MED ORDER — LACTATED RINGERS IV SOLN
INTRAVENOUS | Status: DC
  Administered 2019-07-20: 03:00:00 via INTRAVENOUS

## 2019-07-20 MED ORDER — FENTANYL CITRATE (PF) 100 MCG/2ML IJ SOLN
100.0000 ug | Freq: Once | INTRAMUSCULAR | Status: AC
  Administered 2019-07-20: 100 ug via INTRAVENOUS
  Filled 2019-07-20: qty 2

## 2019-07-20 MED ORDER — METRONIDAZOLE 500 MG PO TABS: 500.0000 mg | ORAL_TABLET | Freq: Two times a day (BID) | ORAL | 0 refills | Status: DC

## 2019-07-20 MED ORDER — CYCLOBENZAPRINE HCL 10 MG PO TABS
10.0000 mg | ORAL_TABLET | Freq: Once | ORAL | Status: AC
  Administered 2019-07-20: 10 mg via ORAL
  Filled 2019-07-20: qty 1

## 2019-07-20 MED ORDER — DOCUSATE SODIUM 100 MG PO CAPS: 100.0000 mg | ORAL_CAPSULE | Freq: Every day | ORAL | Status: DC

## 2019-07-20 MED ORDER — BETAMETHASONE SOD PHOS & ACET 6 (3-3) MG/ML IJ SUSP: 12.0000 mg | Freq: Once | INTRAMUSCULAR | Status: DC

## 2019-07-20 MED ORDER — ACETAMINOPHEN 325 MG PO TABS: 650.0000 mg | ORAL_TABLET | ORAL | Status: DC | PRN

## 2019-07-20 MED ORDER — NIFEDIPINE ER OSMOTIC RELEASE 30 MG PO TB24: 30.0000 mg | ORAL_TABLET | Freq: Every day | ORAL | 2 refills | Status: DC

## 2019-07-20 MED ORDER — LACTATED RINGERS IV BOLUS
1000.0000 mL | Freq: Once | INTRAVENOUS | Status: AC
  Administered 2019-07-20: 1000 mL via INTRAVENOUS

## 2019-07-20 MED ORDER — LACTATED RINGERS IV SOLN: INTRAVENOUS | Status: DC

## 2019-07-20 MED ORDER — NIFEDIPINE 10 MG PO CAPS
10.0000 mg | ORAL_CAPSULE | ORAL | Status: AC | PRN
  Administered 2019-07-20 (×4): 10 mg via ORAL
  Filled 2019-07-20 (×4): qty 1

## 2019-07-20 MED ORDER — TRAMADOL HCL 50 MG PO TABS: 50.0000 mg | ORAL_TABLET | Freq: Four times a day (QID) | ORAL | 0 refills | Status: DC | PRN

## 2019-07-20 MED ORDER — PROMETHAZINE HCL 25 MG/ML IJ SOLN
12.5000 mg | Freq: Once | INTRAMUSCULAR | Status: AC
  Administered 2019-07-20: 12.5 mg via INTRAVENOUS
  Filled 2019-07-20: qty 1

## 2019-07-20 MED ORDER — PRENATAL MULTIVITAMIN CH: 1.0000 | ORAL_TABLET | Freq: Every day | ORAL | Status: DC

## 2019-07-20 MED ORDER — ZOLPIDEM TARTRATE 5 MG PO TABS: 5.0000 mg | ORAL_TABLET | Freq: Every evening | ORAL | Status: DC | PRN

## 2019-07-20 MED ORDER — CALCIUM CARBONATE ANTACID 500 MG PO CHEW: 2.0000 | CHEWABLE_TABLET | ORAL | Status: DC | PRN

## 2019-07-20 MED ORDER — FENTANYL CITRATE (PF) 100 MCG/2ML IJ SOLN: 100.0000 ug | INTRAMUSCULAR | Status: DC | PRN

## 2019-07-20 MED ORDER — BETAMETHASONE SOD PHOS & ACET 6 (3-3) MG/ML IJ SUSP
12.0000 mg | Freq: Once | INTRAMUSCULAR | Status: AC
  Administered 2019-07-20: 12 mg via INTRAMUSCULAR
  Filled 2019-07-20: qty 2

## 2019-07-20 NOTE — MAU Provider Note (Signed)
History     CSN: [id number]  Arrival date and time: 07/19/19 2212   First Provider Initiated Contact with Patient 07/20/19 0036      Chief Complaint  Patient presents with  . Contractions   Wanda Porter is a 23 y.o. G3P1011 at 5117w6d who [facility] to MAU [facility] for contractions. Reports the contractions to be mostly in her back and radiates around to her lower abdomen. They began at 0700 yesterday and initially thought them to be [facility] Hicks contractions. They have become stronger throughout the day. Reports contractions are 4-5 minutes apart.  Patient reports recent IC yesterday. Denies LOF, vaginal bleeding, discharge or urinary symptoms. +FM.    OB History    Gravida  3   Para  1   Term  1   Preterm      AB  1   Living  1     SAB      TAB  1   Ectopic      Multiple  0   Live Births  1           Past Medical History:  Diagnosis Date  . Pre-eclampsia     History reviewed. No pertinent surgical history.  Family History  Problem Relation Age of Onset  . Diabetes Other   . Hypertension Other     Social History   Tobacco Use  . Smoking status: Current Some Day Smoker    Packs/day: 0.25    Types: Cigarettes  . Smokeless tobacco: Never Used  Substance Use Topics  . Alcohol use: No  . Drug use: No    Allergies: No Known Allergies  No medications prior to admission.    Review of Systems  Constitutional: Negative.   Respiratory: Negative.   Cardiovascular: Negative.   Gastrointestinal: Positive for abdominal pain. Negative for constipation, diarrhea, nausea and vomiting.  Genitourinary: Negative.   Musculoskeletal: Negative.   Neurological: Negative.    Physical Exam   Blood pressure 119/63, pulse 87, temperature 98.7 F (37.1 C), resp. rate 19, last menstrual period 11/24/2018, SpO2 98 %, unknown if currently breastfeeding.  Vitals:  BP 123/68   Pulse 97   Temp 98.7 F (37.1 C)   Resp 19   LMP 11/24/2018 (Exact  Date)   SpO2 98%   Physical Examination: CONSTITUTIONAL: Well-developed, well-nourished female in no acute distress.  HENT:  Normocephalic, atraumatic, External right and left ear normal. Oropharynx is clear and moist EYES: Conjunctivae and EOM are normal. Pupils are equal, round, and reactive to light.  NECK: Normal range of motion, supple, no masses SKIN: Skin is warm and dry. No rash noted. Not diaphoretic. No erythema. No pallor. NEUROLGIC: Alert and oriented to person, place, and time. Normal reflexes, muscle tone coordination. No cranial nerve deficit noted. PSYCHIATRIC: Normal mood and affect. Normal behavior. Normal judgment and thought content. ABDOMEN: gravid appropriate for gestational age, moderate contractions palpated  MUSCULOSKELETAL: Normal range of motion. No edema and no tenderness. 2+ distal pulses.  MAU Course  Procedures [facility] , noted cervix 2/50/ballotable  Membranes:intact Fetal Monitoring:Baseline: 130 bpm, Variability: Good {> 6 bpm), Accelerations: Reactive and Decelerations: Absent Tocometer: 2-5 minutes/ moderate by palpation   MDM:  Procardia x4 doses  Flexeril 10mg   LR bolus  Recheck cervix around 0200   Cervical examination at 0221: 2.5/60/ballotable  Patient rates contractions 8/10 C/w Dr Macon LargeAnyanwu, recommends therapeutic rest, BMZ, vaginal cultures and recheck in 2-3 hours  Rx for fentanyl and phenergan ordered  1st BMZ dose given   Cervical examination at 0521 noted slight cervical change:  Dilation: 3.5 Effacement (%): 60 Cervical Position: Posterior Station: -3 Presentation: Vertex Exam by:: Wende Bushy, CNM  Reviewed labs: +clue cells otherwise negative, GC/C and GBS pending  Patient reports continued pain, rates 5/10- second dose of Fentanyl given C/w Dr Harolyn Rutherford on assessment and evaluation  recommends observation on OB Speciality care   Orders for admission placed After being prepared for  admission, patient does not want to be admitted d/t repeat COVID testing  Dr Harolyn Rutherford at bedside to discuss with patient, patient wants to be discharged home. Dr Harolyn Rutherford okay with discharge home, plans to order procardia, pain management at home and tx for BV. Admission ordered discontinued and discharge orders placed.  NST reactive and reassuring  Patient to follow up in office tomorrow morning for 2nd BMZ injection. Discussed reasons to return to MAU. Patient verbalizes understanding   Labs:  Results for orders placed or performed during the hospital encounter of 07/19/19 (from the past 24 hour(s))  Urinalysis, Routine w reflex microscopic     Status: Abnormal   Collection Time: 07/20/19 12:39 AM  Result Value Ref Range   Color, Urine YELLOW YELLOW   APPearance CLEAR CLEAR   Specific Gravity, Urine 1.027 1.005 - 1.030   pH 6.0 5.0 - 8.0   Glucose, UA NEGATIVE NEGATIVE mg/dL   Hgb urine dipstick NEGATIVE NEGATIVE   Bilirubin Urine NEGATIVE NEGATIVE   Ketones, ur 20 (A) NEGATIVE mg/dL   Protein, ur NEGATIVE NEGATIVE mg/dL   Nitrite NEGATIVE NEGATIVE   Leukocytes,Ua NEGATIVE NEGATIVE  Wet prep, genital     Status: Abnormal   Collection Time: 07/20/19  2:20 AM  Result Value Ref Range   Yeast Wet Prep HPF POC NONE SEEN NONE SEEN   Trich, Wet Prep NONE SEEN NONE SEEN   Clue Cells Wet Prep HPF POC PRESENT (A) NONE SEEN   WBC, Wet Prep HPF POC MANY (A) NONE SEEN   Sperm NONE SEEN    Assessment and Plan   1. Threatened preterm labor, third trimester   2. Preterm contractions   3. BV (bacterial vaginosis)    Discharge home Follow up in office tomorrow for second BMZ injection  Return to MAU as needed PTL precautions discussed  Pelvic rest and hydration   Follow-up [place] for [facility]. Schedule an appointment as soon as possible for a visit on 07/21/2019.   Specialty: Obstetrics and Gynecology Why: Make appointment to have second betamethasone  injection  Contact information: [address] [place], [place] [id number] [place] [id number] [phone]         Allergies as of 07/20/2019   No Known Allergies     Medication List    TAKE these medications   acetaminophen 325 MG tablet Commonly known as: TYLENOL Take 325 mg by mouth every 6 (six) hours as needed for mild pain.   metroNIDAZOLE 500 MG tablet Commonly known as: FLAGYL Take 1 tablet (500 mg total) by mouth 2 (two) times daily.   multivitamin-prenatal 27-0.8 MG Tabs tablet Take 1 tablet by mouth daily at 12 noon.   NIFEdipine 30 MG 24 hr tablet Commonly known as: PROCARDIA-XL/NIFEDICAL-XL Take 1 tablet (30 mg total) by mouth daily. Can increase to twice a day as needed for symptomatic contractions   traMADol 50 MG tablet Commonly known as: ULTRAM Take 1-2 tablets (50-100  mg total) by mouth every 6 (six) hours as needed for severe pain.       Sharyon CableVeronica C Natalynn Pedone CNM 07/20/2019, 7:15 AM

## 2019-07-20 NOTE — Progress Notes (Signed)
   PRENATAL VISIT NOTE  Subjective:  Wanda Porter is a 23 y.o. G3P1011 at [redacted]w[redacted]d being seen today for ongoing prenatal care.  She is currently monitored for the following issues for this high-risk pregnancy and has History of pre-eclampsia in prior pregnancy, currently pregnant; Patient's reproductive partner has sickle cell trait; Supervision of low-risk pregnancy; Irregular fetal heart rate; and Preterm uterine contractions in third trimester, antepartum on their problem list.  Patient reports legs are tingling.  Contractions: Irregular. Vag. Bleeding: None.  Movement: (!) Decreased. Denies leaking of fluid.   The following portions of the patient's history were reviewed and updated as appropriate: allergies, current medications, past family history, past medical history, past social history, past surgical history and problem list.   Objective:   Vitals:   07/20/19 1540  BP: 131/71  Pulse: (!) 108  Weight: 144 lb (65.3 kg)    Fetal Status:     Movement: (!) Decreased  Presentation: Vertex  General:  Alert, oriented and cooperative. Patient is in no acute distress.  Skin: Skin is warm and dry. No rash noted.   Cardiovascular: Normal heart rate noted  Respiratory: Normal respiratory effort, no problems with respiration noted  Abdomen: Soft, gravid, appropriate for gestational age.  Pain/Pressure: Present     Pelvic: Cervical exam performed Dilation: 3 Effacement (%): 50 Station: Ballotable  Extremities: Normal range of motion.  Edema: Trace, DTRs 2+ bilaterally, good strength  Mental Status: Normal mood and affect. Normal behavior. Normal judgment and thought content.   Assessment and Plan:  Pregnancy: G3P1011 at [redacted]w[redacted]d 1. History of pre-eclampsia in prior pregnancy, currently pregnant  - taking a baby asa daily  2. Encounter for supervision of low-risk pregnancy, antepartum - She does not have a BP cuff at home.  3. Preterm uterine contractions in third trimester, antepartum -  she was seen at MAU yesterday with cervical dilation, she declined admission, started procardia and got BMZ x 1 - tomorrow she gets BMZ #2 - PTL precautions reviewed  4. Decreased FM today- NST today  Preterm labor symptoms and general obstetric precautions including but not limited to vaginal bleeding, contractions, leaking of fluid and fetal movement were reviewed in detail with the patient. Please refer to After Visit Summary for other counseling recommendations.   No follow-ups on file.  Future Appointments  Date Time Provider [place]  07/21/2019  8:30 AM WOC MAU FU [facility]OC-[facility]OCA WOC  08/07/2019  8:15 AM Hillard Danker, Myles Rosenthal, PA-C [facility]OC[facility] WOC    Emily Filbert, MD

## 2019-07-20 NOTE — Discharge Instructions (Signed)
Preterm Labor and Birth Information °Pregnancy normally lasts 39-41 weeks. Preterm labor is when labor starts early. It starts before you have been pregnant for 37 whole weeks. °What are the risk factors for preterm labor? °Preterm labor is more likely to occur in women who: °· Have an infection while pregnant. °· Have a cervix that is short. °· Have gone into preterm labor before. °· Have had surgery on their cervix. °· Are younger than age 23. °· Are older than age 35. °· Are African American. °· Are pregnant with two or more babies. °· Take street drugs while pregnant. °· Smoke while pregnant. °· Do not gain enough weight while pregnant. °· Got pregnant right after another pregnancy. °What are the symptoms of preterm labor? °Symptoms of preterm labor include: °· Cramps. The cramps may feel like the cramps some women get during their period. The cramps may happen with watery poop (diarrhea). °· Pain in the belly (abdomen). °· Pain in the lower back. °· Regular contractions or tightening. It may feel like your belly is getting tighter. °· Pressure in the lower belly that seems to get stronger. °· More fluid (discharge) leaking from the vagina. The fluid may be watery or bloody. °· Water breaking. °Why is it important to notice signs of preterm labor? °Babies who are born early may not be fully developed. They have a higher chance for: °· Long-term heart problems. °· Long-term lung problems. °· Trouble controlling body systems, like breathing. °· Bleeding in the brain. °· A condition called cerebral palsy. °· Learning difficulties. °· Death. °These risks are highest for babies who are born before 34 weeks of pregnancy. °How is preterm labor treated? °Treatment depends on: °· How long you were pregnant. °· Your condition. °· The health of your baby. °Treatment may involve: °· Having a stitch (suture) placed in your cervix. When you give birth, your cervix opens so the baby can come out. The stitch keeps the cervix  from opening too soon. °· Staying at the hospital. °· Taking or getting medicines, such as: °? Hormone medicines. °? Medicines to stop contractions. °? Medicines to help the baby’s lungs develop. °? Medicines to prevent your baby from having cerebral palsy. °What should I do if I am in preterm labor? °If you think you are going into labor too soon, call your doctor right away. °How can I prevent preterm labor? °· Do not use any tobacco products. °? Examples of these are cigarettes, chewing tobacco, and e-cigarettes. °? If you need help quitting, ask your doctor. °· Do not use street drugs. °· Do not use any medicines unless you ask your doctor if they are safe for you. °· Talk with your doctor before taking any herbal supplements. °· Make sure you gain enough weight. °· Watch for infection. If you think you might have an infection, get it checked right away. °· If you have gone into preterm labor before, tell your doctor. °This information is not intended to replace advice given to you by your health care provider. Make sure you discuss any questions you have with your health care provider. °Document Released: 03/01/2009 Document Revised: 03/27/2019 Document Reviewed: 04/25/2016 °Elsevier Patient Education © 2020 [employer] Inc. ° °

## 2019-07-20 NOTE — MAU Note (Signed)
Transferred from [facility] for R/O PTL.  Reports she hasn't had any issues with PTL before.  Previous pre-e in her last pregnancy-no issues with her current one.  Reports + FM.  No LOF.  May have lost her mucous plug tonight?

## 2019-07-21 ENCOUNTER — Ambulatory Visit: Payer: Medicaid Other

## 2019-07-21 LAB — GC/CHLAMYDIA PROBE AMP (~~LOC~~) NOT AT ARMC
Chlamydia: NEGATIVE
Neisseria Gonorrhea: NEGATIVE

## 2019-07-22 LAB — CULTURE, BETA STREP (GROUP B ONLY)

## 2019-07-30 ENCOUNTER — Telehealth: Payer: Self-pay | Admitting: Medical

## 2019-07-30 ENCOUNTER — Telehealth: Payer: Self-pay | Admitting: Family Medicine

## 2019-07-30 NOTE — Telephone Encounter (Signed)
Patient called in stating that she had missed a call from the office about an appointment change. Patient was give the date that her appointment was changed too ( 8/18 @ 10:35). Patient stated that she can only do Fridays due to working from home. Patient was already scheduled for an appointment on 8/21 @ 8:15. Patient's appointment was cancelled for 8/18 and patient instructed to keep the appointment she has on 8/21.

## 2019-07-30 NOTE — Telephone Encounter (Signed)
Attempted to call patient about appointment change. Left a voice mail.

## 2019-07-31 ENCOUNTER — Encounter: Payer: Medicaid Other | Admitting: Medical

## 2019-08-03 ENCOUNTER — Telehealth: Payer: Self-pay | Admitting: *Deleted

## 2019-08-03 NOTE — Telephone Encounter (Signed)
Received a voicemail from this am stating she had started speaking with somene thru [place]  But wants to know if she can get an Ultrasound when she is here Friday for her visit. States she wants to make sure her daughter is the right weight, etc. States she has been trying to come out and she also wants to talk about the pains she is having that are unbearable.

## 2019-08-03 NOTE — Telephone Encounter (Addendum)
I reviewed Laurelle's messages she has sent and her chart.  I called Joshlyn to discuss her concerns. She states the last time she went to the hospital for uc's she was told she is 3.5 cm dilated and they gave her 4 pills.  She states is still having contractions often if she does anything.  I asked if she is taking the procardia and she says; yes , it doesn't do anything. She states she took all the medicine they prescribed including the 4 pills.  I asked her she felt her baby move well everyday and she said yes.  She denies bleeding or discharge.  I asked if she was having 4 or more uc's per hour and she said she doesn't time them. I asked her to time them and if she is having 4 or more in an hour she should lie down, drink water and if still having frequent uc's in 30 minutes to go to [facility] to be evaluated for preterm labor.  She wants to know if that can be checked for that when she comes for her next ob visit Friday. I informed her if she is having frequent contractions now - she needs to be evaluated at the hospital today because if she is having preterm labor-they can treat her there.  I informed her if she is not having frequent contractions she can keep her appointment. She wants to know if they can strip her membranes because she is so uncomfortable with having uc's, etc. I explained we do not strip membranes when you are preterm - we only do that later when you are about 39 weeks. I explained she can discuss with provider at her next visit Friday. She then asked if she could have an [place] scheduled to be done Friday when she is here. I explained we only do [place] for a medical reason. She says she hasn't had one since she left her other provider. I explained we only do for a medical reason and I can have a provider review her chart to see if she needs and [place].  She asked if I would please review with a provider and then message her. I stressed she should go to the Hospital if she  is having 4 or more uc's per hour for more than one hour.  I reviewed chart with Dr. Harolyn Rutherford who reviewed in [place] her last 2 [place] at [facility] and said no further [place] medically indicated at this time unless found at her next visit.  I messaged Rick this information. Jen Benedict,RN

## 2019-08-04 ENCOUNTER — Encounter: Payer: Medicaid Other | Admitting: Nurse Practitioner

## 2019-08-06 ENCOUNTER — Telehealth: Payer: Self-pay | Admitting: Obstetrics and Gynecology

## 2019-08-06 NOTE — Telephone Encounter (Signed)
Called the patient to inform of the upcoming appointment. Left a detailed voicemail informing of the wearing a face mask, no visitors or children due to Covid19 restrictions. Also if you have been in contact with someone who has had covid, been diagnosed with covid, or experienced any flu-like symptoms in the last 14 days please call our office to reschedule the appointment. If have any questions our office number is [phone] °

## 2019-08-07 ENCOUNTER — Other Ambulatory Visit: Payer: Self-pay

## 2019-08-07 ENCOUNTER — Ambulatory Visit (INDEPENDENT_AMBULATORY_CARE_PROVIDER_SITE_OTHER): Payer: Medicaid Other | Admitting: Medical

## 2019-08-07 ENCOUNTER — Encounter: Payer: Self-pay | Admitting: Medical

## 2019-08-07 VITALS — BP 114/64 | HR 88 | Temp 98.4°F | Wt 146.3 lb

## 2019-08-07 DIAGNOSIS — Z3490 Encounter for supervision of normal pregnancy, unspecified, unspecified trimester: Secondary | ICD-10-CM

## 2019-08-07 DIAGNOSIS — O09299 Supervision of pregnancy with other poor reproductive or obstetric history, unspecified trimester: Secondary | ICD-10-CM

## 2019-08-07 DIAGNOSIS — O09293 Supervision of pregnancy with other poor reproductive or obstetric history, third trimester: Secondary | ICD-10-CM

## 2019-08-07 DIAGNOSIS — O4703 False labor before 37 completed weeks of gestation, third trimester: Secondary | ICD-10-CM

## 2019-08-07 DIAGNOSIS — Z3A36 36 weeks gestation of pregnancy: Secondary | ICD-10-CM

## 2019-08-07 NOTE — Progress Notes (Signed)
Pt reports UC's q 5-7 minutes apart. She is concerned about developing pre-eclampsia.

## 2019-08-07 NOTE — Patient Instructions (Signed)
Fetal Movement Counts Patient Name: ________________________________________________ Patient Due Date: ____________________ What is a fetal movement count?  A fetal movement count is the number of times that you feel your baby move during a certain amount of time. This may also be called a fetal kick count. A fetal movement count is recommended for every pregnant woman. You may be asked to start counting fetal movements as early as week 28 of your pregnancy. Pay attention to when your baby is most active. You may notice your baby's sleep and wake cycles. You may also notice things that make your baby move more. You should do a fetal movement count:  When your baby is normally most active.  At the same time each day. A good time to count movements is while you are resting, after having something to eat and drink. How do I count fetal movements? 1. Find a quiet, comfortable area. Sit, or lie down on your side. 2. Write down the date, the start time and stop time, and the number of movements that you felt between those two times. Take this information with you to your health care visits. 3. For 2 hours, count kicks, flutters, swishes, rolls, and jabs. You should feel at least 10 movements during 2 hours. 4. You may stop counting after you have felt 10 movements. 5. If you do not feel 10 movements in 2 hours, have something to eat and drink. Then, keep resting and counting for 1 hour. If you feel at least 4 movements during that hour, you may stop counting. Contact a health care provider if:  You feel fewer than 4 movements in 2 hours.  Your baby is not moving like he or she usually does. Date: ____________ Start time: ____________ Stop time: ____________ Movements: ____________ Date: ____________ Start time: ____________ Stop time: ____________ Movements: ____________ Date: ____________ Start time: ____________ Stop time: ____________ Movements: ____________ Date: ____________ Start time:  ____________ Stop time: ____________ Movements: ____________ Date: ____________ Start time: ____________ Stop time: ____________ Movements: ____________ Date: ____________ Start time: ____________ Stop time: ____________ Movements: ____________ Date: ____________ Start time: ____________ Stop time: ____________ Movements: ____________ Date: ____________ Start time: ____________ Stop time: ____________ Movements: ____________ Date: ____________ Start time: ____________ Stop time: ____________ Movements: ____________ This information is not intended to replace advice given to you by your health care provider. Make sure you discuss any questions you have with your health care provider. Document Released: 01/02/2007 Document Revised: 12/23/2018 Document Reviewed: 01/12/2016 Elsevier Patient Education  2020 [employer] Braxton Hicks Contractions Contractions of the uterus can occur throughout pregnancy, but they are not always a sign that you are in labor. You may have practice contractions called Braxton Hicks contractions. These false labor contractions are sometimes confused with true labor. What are Braxton Hicks contractions? Braxton Hicks contractions are tightening movements that occur in the muscles of the uterus before labor. Unlike true labor contractions, these contractions do not result in opening (dilation) and thinning of the cervix. Toward the end of pregnancy (32-34 weeks), Braxton Hicks contractions can happen more often and may become stronger. These contractions are sometimes difficult to tell apart from true labor because they can be very uncomfortable. You should not feel embarrassed if you go to the hospital with false labor. Sometimes, the only way to tell if you are in true labor is for your health care provider to look for changes in the cervix. The health care provider will do a physical exam and may monitor your contractions. If you   are not in true labor, the exam should show  that your cervix is not dilating and your water has not broken. If there are no other health problems associated with your pregnancy, it is completely safe for you to be sent home with false labor. You may continue to have Braxton Hicks contractions until you go into true labor. How to tell the difference between true labor and false labor True labor  Contractions last 30-70 seconds.  Contractions become very regular.  Discomfort is usually felt in the top of the uterus, and it spreads to the lower abdomen and low back.  Contractions do not go away with walking.  Contractions usually become more intense and increase in frequency.  The cervix dilates and gets thinner. False labor  Contractions are usually shorter and not as strong as true labor contractions.  Contractions are usually irregular.  Contractions are often felt in the front of the lower abdomen and in the groin.  Contractions may go away when you walk around or change positions while lying down.  Contractions get weaker and are shorter-lasting as time goes on.  The cervix usually does not dilate or become thin. Follow these instructions at home:   Take over-the-counter and prescription medicines only as told by your health care provider.  Keep up with your usual exercises and follow other instructions from your health care provider.  Eat and drink lightly if you think you are going into labor.  If Braxton Hicks contractions are making you uncomfortable: ? Change your position from lying down or resting to walking, or change from walking to resting. ? Sit and rest in a tub of warm water. ? Drink enough fluid to keep your urine pale yellow. Dehydration may cause these contractions. ? Do slow and deep breathing several times an hour.  Keep all follow-up prenatal visits as told by your health care provider. This is important. Contact a health care provider if:  You have a fever.  You have continuous pain in  your abdomen. Get help right away if:  Your contractions become stronger, more regular, and closer together.  You have fluid leaking or gushing from your vagina.  You pass blood-tinged mucus (bloody show).  You have bleeding from your vagina.  You have low back pain that you never had before.  You feel your baby's head pushing down and causing pelvic pressure.  Your baby is not moving inside you as much as it used to. Summary  Contractions that occur before labor are called Braxton Hicks contractions, false labor, or practice contractions.  Braxton Hicks contractions are usually shorter, weaker, farther apart, and less regular than true labor contractions. True labor contractions usually become progressively stronger and regular, and they become more frequent.  Manage discomfort from Braxton Hicks contractions by changing position, resting in a warm bath, drinking plenty of water, or practicing deep breathing. This information is not intended to replace advice given to you by your health care provider. Make sure you discuss any questions you have with your health care provider. Document Released: 04/18/2017 Document Revised: 11/15/2017 Document Reviewed: 04/18/2017 Elsevier Patient Education  2020 [employer]  

## 2019-08-07 NOTE — Progress Notes (Signed)
   PRENATAL VISIT NOTE  Subjective:  Wanda Porter is a 23 y.o. G3P1011 at [redacted]w[redacted]d being seen today for ongoing prenatal care.  She is currently monitored for the following issues for this low-risk pregnancy and has History of pre-eclampsia in prior pregnancy, currently pregnant; Patient's reproductive partner has sickle cell trait; Supervision of low-risk pregnancy; Irregular fetal heart rate; and Preterm uterine contractions in third trimester, antepartum on their problem list.  Patient reports contractions since 34 weeks, q 3-5 minutes.  Contractions: Regular. Vag. Bleeding: None.  Movement: Present. Denies leaking of fluid.   The following portions of the patient's history were reviewed and updated as appropriate: allergies, current medications, past family history, past medical history, past social history, past surgical history and problem list.   Objective:   Vitals:   08/07/19 0835  BP: 114/64  Pulse: 88  Temp: 98.4 F (36.9 C)  Weight: 146 lb 4.8 oz (66.4 kg)    Fetal Status: Fetal Heart Rate (bpm): 132 Fundal Height: 37 cm Movement: Present  Presentation: Vertex  General:  Alert, oriented and cooperative. Patient is in no acute distress.  Skin: Skin is warm and dry. No rash noted.   Cardiovascular: Normal heart rate noted  Respiratory: Normal respiratory effort, no problems with respiration noted  Abdomen: Soft, gravid, appropriate for gestational age.  Pain/Pressure: Present     Pelvic: Cervical exam performed Dilation: 3 Effacement (%): 50 Station: -3  Extremities: Normal range of motion.  Edema: None  Mental Status: Normal mood and affect. Normal behavior. Normal judgment and thought content.   Assessment and Plan:  Pregnancy: G3P1011 at [redacted]w[redacted]d 1. Encounter for supervision of low-risk pregnancy, antepartum - GC/Chlamydia performed 2 weeks ago was negative, did not repeat  - Strep Gp B NAA  2. History of pre-eclampsia in prior pregnancy, currently pregnant -  Normotensive today  - Patient does not have a BP cuff and will need in-person visits for remainder of pregnancy   3. Preterm uterine contractions in third trimester, antepartum - Patient states continued regular contractions even while taking Procardia as directed  - Discussed with Dr. Ilda Basset, patient can try Flexeril or Ambien - These options were discussed with the patient who declined all of the above due to concern for somnolence while she is home with her 23 year old   Preterm labor symptoms and general obstetric precautions including but not limited to vaginal bleeding, contractions, leaking of fluid and fetal movement were reviewed in detail with the patient. Please refer to After Visit Summary for other counseling recommendations.   Return in about 1 week (around 08/14/2019) for LOB, In-Person.  No future appointments.  Kerry Hough, PA-C

## 2019-08-09 LAB — STREP GP B NAA: Strep Gp B NAA: NEGATIVE

## 2019-08-11 ENCOUNTER — Other Ambulatory Visit: Payer: Self-pay | Admitting: *Deleted

## 2019-08-11 ENCOUNTER — Encounter: Payer: Self-pay | Admitting: *Deleted

## 2019-08-11 MED ORDER — BLOOD PRESSURE KIT DEVI: 1.0000 | 0 refills | Status: DC | PRN

## 2019-08-11 NOTE — Progress Notes (Signed)
bp cuff ordered

## 2019-08-19 ENCOUNTER — Encounter (HOSPITAL_COMMUNITY): Payer: Self-pay | Admitting: *Deleted

## 2019-08-19 ENCOUNTER — Other Ambulatory Visit: Payer: Self-pay

## 2019-08-19 ENCOUNTER — Inpatient Hospital Stay (HOSPITAL_COMMUNITY)
Admission: AD | Admit: 2019-08-19 | Discharge: 2019-08-19 | Disposition: A | Discharge status: Home / Self Care | Payer: Medicaid Other | Attending: Family Medicine | Admitting: Family Medicine

## 2019-08-19 DIAGNOSIS — O26893 Other specified pregnancy related conditions, third trimester: Secondary | ICD-10-CM | POA: Diagnosis not present

## 2019-08-19 DIAGNOSIS — R51 Headache: Secondary | ICD-10-CM | POA: Insufficient documentation

## 2019-08-19 DIAGNOSIS — O99333 Smoking (tobacco) complicating pregnancy, third trimester: Secondary | ICD-10-CM | POA: Insufficient documentation

## 2019-08-19 DIAGNOSIS — O471 False labor at or after 37 completed weeks of gestation: Secondary | ICD-10-CM | POA: Diagnosis present

## 2019-08-19 DIAGNOSIS — F17210 Nicotine dependence, cigarettes, uncomplicated: Secondary | ICD-10-CM | POA: Insufficient documentation

## 2019-08-19 DIAGNOSIS — Z3A38 38 weeks gestation of pregnancy: Secondary | ICD-10-CM | POA: Diagnosis not present

## 2019-08-19 DIAGNOSIS — R519 Headache, unspecified: Secondary | ICD-10-CM | POA: Diagnosis present

## 2019-08-19 LAB — CBC
HCT: 36.1 % (ref 36.0–46.0)
Hemoglobin: 12.1 g/dL (ref 12.0–15.0)
MCH: 29.4 pg (ref 26.0–34.0)
MCHC: 33.5 g/dL (ref 30.0–36.0)
MCV: 87.6 fL (ref 80.0–100.0)
Platelets: 175 10*3/uL (ref 150–400)
RBC: 4.12 MIL/uL (ref 3.87–5.11)
RDW: 13.4 % (ref 11.5–15.5)
WBC: 7.3 10*3/uL (ref 4.0–10.5)
nRBC: 0 % (ref 0.0–0.2)

## 2019-08-19 LAB — COMPREHENSIVE METABOLIC PANEL
ALT: 10 U/L (ref 0–44)
AST: 16 U/L (ref 15–41)
Albumin: 2.8 g/dL — ABNORMAL LOW (ref 3.5–5.0)
Alkaline Phosphatase: 361 U/L — ABNORMAL HIGH (ref 38–126)
Anion gap: 11 (ref 5–15)
BUN: <5 mg/dL — ABNORMAL LOW (ref 6–20)
CO2: 20 mmol/L — ABNORMAL LOW (ref 22–32)
Calcium: 9.2 mg/dL (ref 8.9–10.3)
Chloride: 104 mmol/L (ref 98–111)
Creatinine, Ser: 0.55 mg/dL (ref 0.44–1.00)
GFR calc Af Amer: >60 mL/min (ref >60)
GFR calc non Af Amer: >60 mL/min (ref >60)
Glucose, Bld: 91 mg/dL (ref 70–99)
Potassium: 3.7 mmol/L (ref 3.5–5.1)
Sodium: 135 mmol/L (ref 135–145)
Total Bilirubin: 0.5 mg/dL (ref 0.3–1.2)
Total Protein: 6 g/dL — ABNORMAL LOW (ref 6.5–8.1)

## 2019-08-19 LAB — URINALYSIS, ROUTINE W REFLEX MICROSCOPIC
Bilirubin Urine: NEGATIVE
Glucose, UA: NEGATIVE mg/dL
Hgb urine dipstick: NEGATIVE
Ketones, ur: NEGATIVE mg/dL
Leukocytes,Ua: NEGATIVE
Nitrite: NEGATIVE
Protein, ur: NEGATIVE mg/dL
Specific Gravity, Urine: 1.02 (ref 1.005–1.030)
pH: 6 (ref 5.0–8.0)

## 2019-08-19 LAB — PROTEIN / CREATININE RATIO, URINE
Creatinine, Urine: 187.71 mg/dL
Protein Creatinine Ratio: 0.1 mg/mg{Cre} (ref 0.00–0.15)
Total Protein, Urine: 18 mg/dL

## 2019-08-19 NOTE — MAU Provider Note (Signed)
History     CSN: [id number]  Arrival date and time: 08/19/19 1450   First Provider Initiated Contact with Patient 08/19/19 1545      Chief Complaint  Patient presents with  . Contractions   HPI  Ms.  Wanda Porter is a 23 y.o. year old G3P1011 female at 68w2dweeks gestation who presents to MAU reporting UC's every 5 mins since 0500 this morning. She reports good (+) FM, but "less than usual". She also states she has been having elevated BPs (she takes BP at home) and H/A. She does not have a current dx of gHTN or PEC. She has a h/o PEC. She last took Tylenol 1000 mg at 0900 this morning. She complains of swelling in her BLE and she "see floaters". She states that she has had a total of 2 cups of water today. She denies VB or leaking of fluid.    Past Medical History:  Diagnosis Date  . Pre-eclampsia     Past Surgical History:  Procedure Laterality Date  . NO PAST SURGERIES      Family History  Problem Relation Age of Onset  . Diabetes Other   . Hypertension Other   . Diabetes Father   . Hypertension Maternal Grandmother     Social History   Tobacco Use  . Smoking status: Current Some Day Smoker    Packs/day: 0.25    Types: Cigarettes  . Smokeless tobacco: Never Used  Substance Use Topics  . Alcohol use: No  . Drug use: No    Allergies: No Known Allergies  Medications Prior to Admission  Medication Sig Dispense Refill Last Dose  . acetaminophen (TYLENOL) 325 MG tablet Take 325 mg by mouth every 6 (six) hours as needed for mild pain.   08/19/2019  . Prenatal Vit-Fe Fumarate-FA (MULTIVITAMIN-PRENATAL) 27-0.8 MG TABS tablet Take 1 tablet by mouth at bedtime.    08/18/2019 at Unknown time  . Blood Pressure Monitoring (BLOOD PRESSURE KIT) DEVI 1 Device by Does not apply route as needed. ICD 10 :  Z34.90 1 Device 0     Review of Systems  Constitutional: Negative.   HENT: Negative.   Eyes: Positive for photophobia.  Respiratory: Negative.   Cardiovascular: Negative.    Gastrointestinal: Negative.   Endocrine: Negative.   Genitourinary: Positive for pelvic pain (contractions every 5 mins).  Musculoskeletal: Negative.   Skin: Negative.   Allergic/Immunologic: Negative.   Neurological: Positive for headaches.  Hematological: Negative.   Psychiatric/Behavioral: Negative.    Physical Exam   Patient Vitals for the past 24 hrs:  BP Temp Pulse Resp SpO2 Height Weight  08/19/19 1909 124/64 - 88 - - - -  08/19/19 1515 122/72 98.6 F (37 C) (!) 113 20 100 % - -  08/19/19 1506 - - - - - 5' 1"  (1.549 m) 67.4 kg    Physical Exam  Nursing note and vitals reviewed. Constitutional: She is oriented to person, place, and time. She appears well-developed and well-nourished.  HENT:  Head: Normocephalic and atraumatic.  Eyes: Pupils are equal, round, and reactive to light.  Neck: Normal range of motion.  Cardiovascular: Normal rate, regular rhythm, normal heart sounds and intact distal pulses.  Respiratory: Effort normal and breath sounds normal.  GI: Soft. Bowel sounds are normal.  Musculoskeletal: Normal range of motion.  Neurological: She is alert and oriented to person, place, and time. She has normal reflexes.  Skin: Skin is warm and dry.  Psychiatric: She has a normal mood  and affect. Her behavior is normal. Judgment and thought content normal.   NST - FHR: 135 bpm / moderate variability / accels present / decels absent / TOCO: regular every 2.5-4 mins  Dilation: 4(4-4.5) Effacement (%): 70, 80 Cervical Position: Middle Station: -2 Presentation: Vertex Exam by:: Gilmer Mor RN unchanged from previous 2 VEs  MAU Course  Procedures  MDM CCUA CBC CMP P/C Ratio Serial BP's    Results for orders placed or performed during the hospital encounter of 08/19/19 (from the past 24 hour(s))  Urinalysis, Routine w reflex microscopic     Status: None   Collection Time: 08/19/19  3:25 PM  Result Value Ref Range   Color, Urine YELLOW YELLOW    APPearance CLEAR CLEAR   Specific Gravity, Urine 1.020 1.005 - 1.030   pH 6.0 5.0 - 8.0   Glucose, UA NEGATIVE NEGATIVE mg/dL   Hgb urine dipstick NEGATIVE NEGATIVE   Bilirubin Urine NEGATIVE NEGATIVE   Ketones, ur NEGATIVE NEGATIVE mg/dL   Protein, ur NEGATIVE NEGATIVE mg/dL   Nitrite NEGATIVE NEGATIVE   Leukocytes,Ua NEGATIVE NEGATIVE  Protein / creatinine ratio, urine     Status: None   Collection Time: 08/19/19  3:37 PM  Result Value Ref Range   Creatinine, Urine 187.71 mg/dL   Total Protein, Urine 18 mg/dL   Protein Creatinine Ratio 0.10 0.00 - 0.15 mg/mg[Cre]  CBC     Status: None   Collection Time: 08/19/19  3:51 PM  Result Value Ref Range   WBC 7.3 4.0 - 10.5 K/uL   RBC 4.12 3.87 - 5.11 MIL/uL   Hemoglobin 12.1 12.0 - 15.0 g/dL   HCT 36.1 36.0 - 46.0 %   MCV 87.6 80.0 - 100.0 fL   MCH 29.4 26.0 - 34.0 pg   MCHC 33.5 30.0 - 36.0 g/dL   RDW 13.4 11.5 - 15.5 %   Platelets 175 150 - 400 K/uL   nRBC 0.0 0.0 - 0.2 %  Comprehensive metabolic panel     Status: Abnormal   Collection Time: 08/19/19  3:51 PM  Result Value Ref Range   Sodium 135 135 - 145 mmol/L   Potassium 3.7 3.5 - 5.1 mmol/L   Chloride 104 98 - 111 mmol/L   CO2 20 (L) 22 - 32 mmol/L   Glucose, Bld 91 70 - 99 mg/dL   BUN <5 (L) 6 - 20 mg/dL   Creatinine, Ser 0.55 0.44 - 1.00 mg/dL   Calcium 9.2 8.9 - 10.3 mg/dL   Total Protein 6.0 (L) 6.5 - 8.1 g/dL   Albumin 2.8 (L) 3.5 - 5.0 g/dL   AST 16 15 - 41 U/L   ALT 10 0 - 44 U/L   Alkaline Phosphatase 361 (H) 38 - 126 U/L   Total Bilirubin 0.5 0.3 - 1.2 mg/dL   GFR calc non Af Amer >60 >60 mL/min   GFR calc Af Amer >60 >60 mL/min   Anion gap 11 5 - 15      Assessment and Plan  False labor after 37 weeks of gestation without delivery  - Advised to return to MAU with stronger contractions. - Information provided on signs of labor - Advised to return for DFM or no FM, VB, and LOF   Headache in pregnancy, antepartum, third trimester  - May take Tylenol  1000 mg po prn - Advised to stay well-hydrated with at least 10-12 bottles of water daily  - Discharge patient - Keep scheduled appt with [facility] - Patient verbalized  an understanding of the plan of care and agrees.     Laury Deep, MSN, CNM 08/19/2019, 3:45 PM

## 2019-08-19 NOTE — Discharge Instructions (Signed)
Your contractions are coming at a good frequency. They just need to be stronger. Please return to MAU for leaking or gushing of fluid, decreased or no fetal movement, or vaginal bleeding like a period.  You may take Tylenol 1000 mg every 6 hrs as needed for H/A. Please stay well-hydrated with at least 10-12 bottles of water daily.

## 2019-08-19 NOTE — Progress Notes (Signed)
Pt instructed to ambulate in MAU unit and return to room @ 1900 for re-evaluation or sooner if ROM or VB.  Pt verbalized understanding & agreeable.

## 2019-08-19 NOTE — MAU Note (Signed)
Presents with c/o ctxs every 5 minutes apart since 0500.  Denies VB or LOF.  Reports +FM, less than usual.  Pt also states been taking BP @ home and BP has been elevated.  Reports H/A & floaters.  States took Tylenol for H/A @ 0900, no relief.

## 2019-08-20 ENCOUNTER — Telehealth: Payer: Self-pay | Admitting: Medical

## 2019-08-20 NOTE — Telephone Encounter (Signed)
Attempted to call patient about her appointment on 9/4 @ 8:15. No answer left voicemail instructing patient to wear a face mask for the entire appointment and no visitors are allowed during the visit. Patient instructed not to attend the appointment if she was any symptoms. Symptom list and office number left.  °

## 2019-08-21 ENCOUNTER — Other Ambulatory Visit: Payer: Self-pay

## 2019-08-21 ENCOUNTER — Encounter: Payer: Self-pay | Admitting: Medical

## 2019-08-21 ENCOUNTER — Ambulatory Visit (INDEPENDENT_AMBULATORY_CARE_PROVIDER_SITE_OTHER): Payer: Medicaid Other | Admitting: Medical

## 2019-08-21 VITALS — BP 107/73 | HR 105 | Wt 145.5 lb

## 2019-08-21 DIAGNOSIS — Z3A38 38 weeks gestation of pregnancy: Secondary | ICD-10-CM | POA: Diagnosis not present

## 2019-08-21 DIAGNOSIS — O09299 Supervision of pregnancy with other poor reproductive or obstetric history, unspecified trimester: Secondary | ICD-10-CM

## 2019-08-21 DIAGNOSIS — Z3490 Encounter for supervision of normal pregnancy, unspecified, unspecified trimester: Secondary | ICD-10-CM

## 2019-08-21 DIAGNOSIS — O09293 Supervision of pregnancy with other poor reproductive or obstetric history, third trimester: Secondary | ICD-10-CM

## 2019-08-21 NOTE — Patient Instructions (Addendum)
Fetal Movement Counts °Patient Name: ________________________________________________ Patient Due Date: ____________________ °What is a fetal movement count? ° °A fetal movement count is the number of times that you feel your baby move during a certain amount of time. This may also be called a fetal kick count. A fetal movement count is recommended for every pregnant woman. You may be asked to start counting fetal movements as early as week 28 of your pregnancy. °Pay attention to when your baby is most active. You may notice your baby's sleep and wake cycles. You may also notice things that make your baby move more. You should do a fetal movement count: °· When your baby is normally most active. °· At the same time each day. °A good time to count movements is while you are resting, after having something to eat and drink. °How do I count fetal movements? °1. Find a quiet, comfortable area. Sit, or lie down on your side. °2. Write down the date, the start time and stop time, and the number of movements that you felt between those two times. Take this information with you to your health care visits. °3. For 2 hours, count kicks, flutters, swishes, rolls, and jabs. You should feel at least 10 movements during 2 hours. °4. You may stop counting after you have felt 10 movements. °5. If you do not feel 10 movements in 2 hours, have something to eat and drink. Then, keep resting and counting for 1 hour. If you feel at least 4 movements during that hour, you may stop counting. °Contact a health care provider if: °· You feel fewer than 4 movements in 2 hours. °· Your baby is not moving like he or she usually does. °Date: ____________ Start time: ____________ Stop time: ____________ Movements: ____________ °Date: ____________ Start time: ____________ Stop time: ____________ Movements: ____________ °Date: ____________ Start time: ____________ Stop time: ____________ Movements: ____________ °Date: ____________ Start time:  ____________ Stop time: ____________ Movements: ____________ °Date: ____________ Start time: ____________ Stop time: ____________ Movements: ____________ °Date: ____________ Start time: ____________ Stop time: ____________ Movements: ____________ °Date: ____________ Start time: ____________ Stop time: ____________ Movements: ____________ °Date: ____________ Start time: ____________ Stop time: ____________ Movements: ____________ °Date: ____________ Start time: ____________ Stop time: ____________ Movements: ____________ °This information is not intended to replace advice given to you by your health care provider. Make sure you discuss any questions you have with your health care provider. °Document Released: 01/02/2007 Document Revised: 12/23/2018 Document Reviewed: 01/12/2016 °Elsevier Patient Education © 2020 [employer] °Braxton Hicks Contractions °Contractions of the uterus can occur throughout pregnancy, but they are not always a sign that you are in labor. You may have practice contractions called Braxton Hicks contractions. These false labor contractions are sometimes confused with true labor. °What are Braxton Hicks contractions? °Braxton Hicks contractions are tightening movements that occur in the muscles of the uterus before labor. Unlike true labor contractions, these contractions do not result in opening (dilation) and thinning of the cervix. Toward the end of pregnancy (32-34 weeks), Braxton Hicks contractions can happen more often and may become stronger. These contractions are sometimes difficult to tell apart from true labor because they can be very uncomfortable. You should not feel embarrassed if you go to the hospital with false labor. °Sometimes, the only way to tell if you are in true labor is for your health care provider to look for changes in the cervix. The health care provider will do a physical exam and may monitor your contractions. If you   are not in true labor, the exam should show  that your cervix is not dilating and your water has not broken. °If there are no other health problems associated with your pregnancy, it is completely safe for you to be sent home with false labor. You may continue to have Braxton Hicks contractions until you go into true labor. °How to tell the difference between true labor and false labor °True labor °· Contractions last 30-70 seconds. °· Contractions become very regular. °· Discomfort is usually felt in the top of the uterus, and it spreads to the lower abdomen and low back. °· Contractions do not go away with walking. °· Contractions usually become more intense and increase in frequency. °· The cervix dilates and gets thinner. °False labor °· Contractions are usually shorter and not as strong as true labor contractions. °· Contractions are usually irregular. °· Contractions are often felt in the front of the lower abdomen and in the groin. °· Contractions may go away when you walk around or change positions while lying down. °· Contractions get weaker and are shorter-lasting as time goes on. °· The cervix usually does not dilate or become thin. °Follow these instructions at home: ° °· Take over-the-counter and prescription medicines only as told by your health care provider. °· Keep up with your usual exercises and follow other instructions from your health care provider. °· Eat and drink lightly if you think you are going into labor. °· If Braxton Hicks contractions are making you uncomfortable: °? Change your position from lying down or resting to walking, or change from walking to resting. °? Sit and rest in a tub of warm water. °? Drink enough fluid to keep your urine pale yellow. Dehydration may cause these contractions. °? Do slow and deep breathing several times an hour. °· Keep all follow-up prenatal visits as told by your health care provider. This is important. °Contact a health care provider if: °· You have a fever. °· You have continuous pain in  your abdomen. °Get help right away if: °· Your contractions become stronger, more regular, and closer together. °· You have fluid leaking or gushing from your vagina. °· You pass blood-tinged mucus (bloody show). °· You have bleeding from your vagina. °· You have low back pain that you never had before. °· You feel your baby’s head pushing down and causing pelvic pressure. °· Your baby is not moving inside you as much as it used to. °Summary °· Contractions that occur before labor are called Braxton Hicks contractions, false labor, or practice contractions. °· Braxton Hicks contractions are usually shorter, weaker, farther apart, and less regular than true labor contractions. True labor contractions usually become progressively stronger and regular, and they become more frequent. °· Manage discomfort from Braxton Hicks contractions by changing position, resting in a warm bath, drinking plenty of water, or practicing deep breathing. °This information is not intended to replace advice given to you by your health care provider. Make sure you discuss any questions you have with your health care provider. °Document Released: 04/18/2017 Document Revised: 11/15/2017 Document Reviewed: 04/18/2017 °Elsevier Patient Education © 2020 [employer] ° ° °Cervical Ripening: May try one or both ° °Red Raspberry Leaf capsules:  two 300mg or 400mg tablets with each meal, 2-3 times a day  °Potential Side Effects Of Raspberry Leaf:  °Most women do not experience any side effects from drinking raspberry leaf tea. However, nausea and loose stools are possible  ° °Evening Primrose Oil capsules:   may take 1 to 3 capsules daily. May also prick one to release the oil and insert it into your vagina at night.  °Some of the potential side effects:  °Upset stomach  °Loose stools or diarrhea  °Headaches  °Nausea ° °

## 2019-08-21 NOTE — Progress Notes (Signed)
   PRENATAL VISIT NOTE  Subjective:  Wanda Porter is a 23 y.o. G3P1011 at [redacted]w[redacted]d being seen today for ongoing prenatal care.  She is currently monitored for the following issues for this low-risk pregnancy and has History of pre-eclampsia in prior pregnancy, currently pregnant; Patient's reproductive partner has sickle cell trait; Supervision of low-risk pregnancy; Irregular fetal heart rate; and Headache in pregnancy, antepartum, third trimester on their problem list.  Patient reports contractions since 2 days ago.  Contractions: Regular. Vag. Bleeding: None.  Movement: Present. Denies leaking of fluid.   The following portions of the patient's history were reviewed and updated as appropriate: allergies, current medications, past family history, past medical history, past social history, past surgical history and problem list.   Objective:   Vitals:   08/21/19 0825  BP: 107/73  Pulse: (!) 105  Weight: 145 lb 8 oz (66 kg)    Fetal Status: Fetal Heart Rate (bpm): 142 Fundal Height: 38 cm Movement: Present  Presentation: Vertex  General:  Alert, oriented and cooperative. Patient is in no acute distress.  Skin: Skin is warm and dry. No rash noted.   Cardiovascular: Normal heart rate noted  Respiratory: Normal respiratory effort, no problems with respiration noted  Abdomen: Soft, gravid, appropriate for gestational age.  Pain/Pressure: Present     Pelvic: Cervical exam performed Dilation: 4.5 Effacement (%): 50 Station: -3  Extremities: Normal range of motion.  Edema: Trace  Mental Status: Normal mood and affect. Normal behavior. Normal judgment and thought content.   Assessment and Plan:  Pregnancy: G3P1011 at [redacted]w[redacted]d 1. Encounter for supervision of low-risk pregnancy, antepartum - Patient seen in MAU on 9/2 for false labor - Cervix unchanged from that visit today  - Cervical ripening with EPO instructions given  - GBS negative  2. History of pre-eclampsia  - Patient normotensive  today   Term labor symptoms and general obstetric precautions including but not limited to vaginal bleeding, contractions, leaking of fluid and fetal movement were reviewed in detail with the patient. Please refer to After Visit Summary for other counseling recommendations.   Return in about 1 week (around 08/28/2019) for LOB, In-Person.  Future Appointments  Date Time Provider [place]  08/27/2019  9:35 AM Emily Filbert, MD [facility]  09/04/2019  9:15 AM Chancy Milroy, MD [facility]    Kerry Hough, PA-C

## 2019-08-24 ENCOUNTER — Encounter (HOSPITAL_COMMUNITY): Payer: Self-pay | Admitting: *Deleted

## 2019-08-24 ENCOUNTER — Inpatient Hospital Stay (HOSPITAL_COMMUNITY)
Admission: AD | Admit: 2019-08-24 | Discharge: 2019-08-25 | DRG: 807 | Disposition: A | Discharge status: Home / Self Care | Payer: Medicaid Other | Attending: Obstetrics and Gynecology | Admitting: Obstetrics and Gynecology

## 2019-08-24 ENCOUNTER — Other Ambulatory Visit: Payer: Self-pay

## 2019-08-24 DIAGNOSIS — F17210 Nicotine dependence, cigarettes, uncomplicated: Secondary | ICD-10-CM | POA: Diagnosis present

## 2019-08-24 DIAGNOSIS — Z3A39 39 weeks gestation of pregnancy: Secondary | ICD-10-CM

## 2019-08-24 DIAGNOSIS — O99334 Smoking (tobacco) complicating childbirth: Principal | ICD-10-CM | POA: Diagnosis present

## 2019-08-24 DIAGNOSIS — Z20828 Contact with and (suspected) exposure to other viral communicable diseases: Secondary | ICD-10-CM | POA: Diagnosis present

## 2019-08-24 DIAGNOSIS — O26893 Other specified pregnancy related conditions, third trimester: Secondary | ICD-10-CM

## 2019-08-24 DIAGNOSIS — Z975 Presence of (intrauterine) contraceptive device: Secondary | ICD-10-CM

## 2019-08-24 DIAGNOSIS — Z30430 Encounter for insertion of intrauterine contraceptive device: Secondary | ICD-10-CM | POA: Diagnosis not present

## 2019-08-24 LAB — CBC
HCT: 37.6 % (ref 36.0–46.0)
Hemoglobin: 12.4 g/dL (ref 12.0–15.0)
MCH: 29.1 pg (ref 26.0–34.0)
MCHC: 33 g/dL (ref 30.0–36.0)
MCV: 88.3 fL (ref 80.0–100.0)
Platelets: 200 10*3/uL (ref 150–400)
RBC: 4.26 MIL/uL (ref 3.87–5.11)
RDW: 13.6 % (ref 11.5–15.5)
WBC: 8.2 10*3/uL (ref 4.0–10.5)
nRBC: 0 % (ref 0.0–0.2)

## 2019-08-24 LAB — POCT FERN TEST
POCT Fern Test: POSITIVE
POCT Fern Test: POSITIVE

## 2019-08-24 LAB — TYPE AND SCREEN
ABO/RH(D): O POS
Antibody Screen: NEGATIVE

## 2019-08-24 LAB — SARS CORONAVIRUS 2 BY RT PCR (HOSPITAL ORDER, PERFORMED IN ~~LOC~~ HOSPITAL LAB): SARS Coronavirus 2: NEGATIVE

## 2019-08-24 LAB — ABO/RH: ABO/RH(D): O POS

## 2019-08-24 MED ORDER — SIMETHICONE 80 MG PO CHEW: 80.0000 mg | CHEWABLE_TABLET | ORAL | Status: DC | PRN

## 2019-08-24 MED ORDER — ACETAMINOPHEN 325 MG PO TABS: 650.0000 mg | ORAL_TABLET | ORAL | Status: DC | PRN

## 2019-08-24 MED ORDER — ONDANSETRON HCL 4 MG/2ML IJ SOLN: 4.0000 mg | Freq: Four times a day (QID) | INTRAMUSCULAR | Status: DC | PRN

## 2019-08-24 MED ORDER — SOD CITRATE-CITRIC ACID 500-334 MG/5ML PO SOLN: 30.0000 mL | ORAL | Status: DC | PRN

## 2019-08-24 MED ORDER — LEVONORGESTREL 19.5 MCG/DAY IU IUD
INTRAUTERINE_SYSTEM | Freq: Once | INTRAUTERINE | Status: AC
  Administered 2019-08-24: 1 via INTRAUTERINE
  Filled 2019-08-24: qty 1

## 2019-08-24 MED ORDER — OXYCODONE-ACETAMINOPHEN 5-325 MG PO TABS: 1.0000 | ORAL_TABLET | ORAL | Status: DC | PRN

## 2019-08-24 MED ORDER — PHENYLEPHRINE 40 MCG/ML (10ML) SYRINGE FOR IV PUSH (FOR BLOOD PRESSURE SUPPORT)
80.0000 ug | PREFILLED_SYRINGE | INTRAVENOUS | Status: DC | PRN
  Filled 2019-08-24: qty 10

## 2019-08-24 MED ORDER — ONDANSETRON HCL 4 MG PO TABS: 4.0000 mg | ORAL_TABLET | ORAL | Status: DC | PRN

## 2019-08-24 MED ORDER — DIPHENHYDRAMINE HCL 25 MG PO CAPS: 25.0000 mg | ORAL_CAPSULE | Freq: Four times a day (QID) | ORAL | Status: DC | PRN

## 2019-08-24 MED ORDER — EPHEDRINE 5 MG/ML INJ
10.0000 mg | INTRAVENOUS | Status: DC | PRN
  Filled 2019-08-24: qty 2

## 2019-08-24 MED ORDER — TETANUS-DIPHTH-ACELL PERTUSSIS 5-2.5-18.5 LF-MCG/0.5 IM SUSP: 0.5000 mL | Freq: Once | INTRAMUSCULAR | Status: DC

## 2019-08-24 MED ORDER — LIDOCAINE HCL (PF) 1 % IJ SOLN: 30.0000 mL | INTRAMUSCULAR | Status: DC | PRN

## 2019-08-24 MED ORDER — ONDANSETRON HCL 4 MG/2ML IJ SOLN: 4.0000 mg | INTRAMUSCULAR | Status: DC | PRN

## 2019-08-24 MED ORDER — IBUPROFEN 600 MG PO TABS
600.0000 mg | ORAL_TABLET | Freq: Four times a day (QID) | ORAL | Status: DC
  Administered 2019-08-25 (×4): 600 mg via ORAL
  Filled 2019-08-24 (×4): qty 1

## 2019-08-24 MED ORDER — BENZOCAINE-MENTHOL 20-0.5 % EX AERO
1.0000 "application " | INHALATION_SPRAY | CUTANEOUS | Status: DC | PRN
  Administered 2019-08-25: 1 via TOPICAL
  Filled 2019-08-24: qty 56

## 2019-08-24 MED ORDER — OXYCODONE-ACETAMINOPHEN 5-325 MG PO TABS: 2.0000 | ORAL_TABLET | ORAL | Status: DC | PRN

## 2019-08-24 MED ORDER — DIBUCAINE (PERIANAL) 1 % EX OINT: 1.0000 "application " | TOPICAL_OINTMENT | CUTANEOUS | Status: DC | PRN

## 2019-08-24 MED ORDER — FENTANYL CITRATE (PF) 100 MCG/2ML IJ SOLN
100.0000 ug | INTRAMUSCULAR | Status: DC | PRN
  Administered 2019-08-24 (×2): 100 ug via INTRAVENOUS
  Filled 2019-08-24: qty 2

## 2019-08-24 MED ORDER — OXYTOCIN BOLUS FROM INFUSION
500.0000 mL | Freq: Once | INTRAVENOUS | Status: AC
  Administered 2019-08-24: 17:00:00 500 mL via INTRAVENOUS

## 2019-08-24 MED ORDER — DIPHENHYDRAMINE HCL 50 MG/ML IJ SOLN: 12.5000 mg | INTRAMUSCULAR | Status: DC | PRN

## 2019-08-24 MED ORDER — SENNOSIDES-DOCUSATE SODIUM 8.6-50 MG PO TABS
2.0000 | ORAL_TABLET | ORAL | Status: DC
  Administered 2019-08-25: 2 via ORAL
  Filled 2019-08-24: qty 2

## 2019-08-24 MED ORDER — OXYTOCIN 40 UNITS IN NORMAL SALINE INFUSION - SIMPLE MED
2.5000 [IU]/h | INTRAVENOUS | Status: DC
  Filled 2019-08-24: qty 1000

## 2019-08-24 MED ORDER — PRENATAL MULTIVITAMIN CH
1.0000 | ORAL_TABLET | Freq: Every day | ORAL | Status: DC
  Administered 2019-08-25: 12:00:00 1 via ORAL
  Filled 2019-08-24: qty 1

## 2019-08-24 MED ORDER — ACETAMINOPHEN 325 MG PO TABS
650.0000 mg | ORAL_TABLET | ORAL | Status: DC | PRN
  Administered 2019-08-24: 650 mg via ORAL
  Filled 2019-08-24: qty 2

## 2019-08-24 MED ORDER — FENTANYL CITRATE (PF) 100 MCG/2ML IJ SOLN
INTRAMUSCULAR | Status: AC
  Filled 2019-08-24: qty 2

## 2019-08-24 MED ORDER — LACTATED RINGERS IV SOLN: INTRAVENOUS | Status: DC

## 2019-08-24 MED ORDER — LACTATED RINGERS IV SOLN: 500.0000 mL | INTRAVENOUS | Status: DC | PRN

## 2019-08-24 MED ORDER — FENTANYL-BUPIVACAINE-NACL 0.5-0.125-0.9 MG/250ML-% EP SOLN: 12.0000 mL/h | EPIDURAL | Status: DC | PRN

## 2019-08-24 MED ORDER — COCONUT OIL OIL: 1.0000 "application " | TOPICAL_OIL | Status: DC | PRN

## 2019-08-24 MED ORDER — FLEET ENEMA 7-19 GM/118ML RE ENEM: 1.0000 | ENEMA | RECTAL | Status: DC | PRN

## 2019-08-24 MED ORDER — ZOLPIDEM TARTRATE 5 MG PO TABS: 5.0000 mg | ORAL_TABLET | Freq: Every evening | ORAL | Status: DC | PRN

## 2019-08-24 MED ORDER — LACTATED RINGERS IV SOLN: 500.0000 mL | Freq: Once | INTRAVENOUS | Status: DC

## 2019-08-24 MED ORDER — WITCH HAZEL-GLYCERIN EX PADS
1.0000 "application " | MEDICATED_PAD | CUTANEOUS | Status: DC | PRN
  Administered 2019-08-25: 1 via TOPICAL

## 2019-08-24 NOTE — Discharge Summary (Addendum)
Postpartum Discharge Summary      Patient Name: Wanda Porter DOB: 1995/12/30 MRN: [id number]  Date of admission: 08/24/2019 Delivering Provider: Christin Fudge   Date of discharge: 08/25/2019  Admitting diagnosis: water broke Intrauterine pregnancy: [redacted]w[redacted]d    Secondary diagnosis:  Active Problems:   Indication for care in labor or delivery   Single spontaneous delivery   Obstetric vaginal laceration with first degree perineal laceration   IUD (intrauterine device) in place  Additional problems: none     Discharge diagnosis: Term Pregnancy Delivered                                                                                                Post partum procedures:Liletta IUD  Augmentation: none  Complications: None  Hospital course:  Onset of Labor With Vaginal Delivery     23y.o. yo G3P1011 at 327w0das admitted in Latent Labor on 08/24/2019. Patient had an uncomplicated labor course as follows:  Membrane Rupture Time/Date: 12:00 PM ,08/24/2019   Intrapartum Procedures: Episiotomy: None [1] none                                        Lacerations:  None [1] 1st degree Patient had a delivery of a Viable infant. Spontaneously labored w/o augmentation.  10 minute 2nd stage.  PP IUD placed.  08/24/2019  Information for the patient's newborn:  StShirle, Provencal0[[id number]]     Pateint had an uncomplicated postpartum course.  She is ambulating, tolerating a regular diet, passing flatus, and urinating well. Patient is discharged home in stable condition on 08/25/19.  Delivery time: 4:43 PM    Magnesium Sulfate received: No BMZ received: No Rhophylac:No MMR:N/A Transfusion:No  Physical exam  Vitals:   08/24/19 2350 08/25/19 0330 08/25/19 0751 08/25/19 1433  BP: 113/62 120/81 113/73 119/80  Pulse: 90 77 79 73  Resp: 18 18 18 16   Temp: 97.6 F (36.4 C) 98.4 F (36.9 C) 98.2 F (36.8 C) 98.3 F (36.8 C)  TempSrc: Oral Oral Oral Oral  SpO2: 100% 100% 100%    Weight:      Height:       General: alert, cooperative and no distress Lochia: appropriate Uterine Fundus: firm Incision: N/A DVT Evaluation: No evidence of DVT seen on physical exam. No significant calf/ankle edema. Labs: Lab Results  Component Value Date   WBC 8.2 08/24/2019   HGB 12.4 08/24/2019   HCT 37.6 08/24/2019   MCV 88.3 08/24/2019   PLT 200 08/24/2019   CMP Latest Ref Rng & Units 08/19/2019  Glucose 70 - 99 mg/dL 91  BUN 6 - 20 mg/dL <5(L)  Creatinine 0.44 - 1.00 mg/dL 0.55  Sodium 135 - 145 mmol/L 135  Potassium 3.5 - 5.1 mmol/L 3.7  Chloride 98 - 111 mmol/L 104  CO2 22 - 32 mmol/L 20(L)  Calcium 8.9 - 10.3 mg/dL 9.2  Total Protein 6.5 - 8.1 g/dL 6.0(L)  Total Bilirubin 0.3 - 1.2 mg/dL 0.5  Alkaline  Phos 38 - 126 U/L 361(H)  AST 15 - 41 U/L 16  ALT 0 - 44 U/L 10    Discharge instruction: per After Visit Summary and "Baby and Me Booklet".  After visit meds:  Allergies as of 08/25/2019   No Known Allergies     Medication List    STOP taking these medications   acetaminophen 325 MG tablet Commonly known as: TYLENOL   Blood Pressure Kit Devi     TAKE these medications   ibuprofen 600 MG tablet Commonly known as: ADVIL Take 1 tablet (600 mg total) by mouth every 6 (six) hours. Start taking on: August 26, 2019   multivitamin-prenatal 27-0.8 MG Tabs tablet Take 1 tablet by mouth at bedtime.       Diet: routine diet  Activity: Advance as tolerated. Pelvic rest for 6 weeks.   Outpatient follow up:4 weeks Follow up Appt: Future Appointments  Date Time Provider [place]  09/22/2019  3:35 PM Starr Lake, CNM [facility]   Follow up Visit:   Please schedule this patient for Postpartum visit in: 4 weeks with the following provider: Any provider (needs in person to check IUD) For C/S patients schedule nurse incision check in weeks 2 weeks: no Low risk pregnancy complicated by:  Delivery mode:  SVD Birth Control:  PP  IUD placed PP Procedures needed:   Schedule Integrated BH visit: no      Newborn Data: Live born female  Birth Weight:   APGAR: ,   Newborn Delivery   Birth date/time: 08/24/2019 16:43:00 Delivery type: Vaginal, Spontaneous      Baby Feeding: Breast Disposition:home with mother   08/25/2019 Nikki Dom Driver, MD  I confirm that I have verified the information documented in the resident's note and that I have also personally reperformed the history, physical exam and all medical decision making activities of this service and have verified that all service and findings are accurately documented in this student's note.     Starr Lake, [place] 08/25/2019 5:48 PM

## 2019-08-24 NOTE — MAU Note (Signed)
Clear puddle on chair in triage

## 2019-08-24 NOTE — MAU Note (Signed)
Gush of clear fluid, started 1200, still coming. Had some pinkish bloody mucous earlier this morning. Started contracting after water broke. Was 4+ cm last wk. No problems with preg.

## 2019-08-24 NOTE — Discharge Instructions (Signed)
Intrauterine Device Insertion, Care After  This sheet gives you information about how to care for yourself after your procedure. Your health care provider may also give you more specific instructions. If you have problems or questions, contact your health care provider. What can I expect after the procedure? After the procedure, it is common to have:  Cramps and pain in the abdomen.  Light bleeding (spotting) or heavier bleeding that is like your menstrual period. This may last for up to a few days.  Lower back pain.  Dizziness.  Headaches.  Nausea. Follow these instructions at home:  Before resuming sexual activity, check to make sure that you can feel the IUD string(s). You should be able to feel the end of the string(s) below the opening of your cervix. If your IUD string is in place, you may resume sexual activity. ? If you had a hormonal IUD inserted more than 7 days after your most recent period started, you will need to use a backup method of birth control for 7 days after IUD insertion. Ask your health care provider whether this applies to you.  Continue to check that the IUD is still in place by feeling for the string(s) after every menstrual period, or once a month.  Take over-the-counter and prescription medicines only as told by your health care provider.  Do not drive or use heavy machinery while taking prescription pain medicine.  Keep all follow-up visits as told by your health care provider. This is important. Contact a health care provider if:  You have bleeding that is heavier or lasts longer than a normal menstrual cycle.  You have a fever.  You have cramps or abdominal pain that get worse or do not get better with medicine.  You develop abdominal pain that is new or is not in the same area of earlier cramping and pain.  You feel lightheaded or weak.  You have abnormal or bad-smelling discharge from your vagina.  You have pain during sexual activity.   You have any of the following problems with your IUD string(s): ? The string bothers or hurts you or your sexual partner. ? You cannot feel the string. ? The string has gotten longer.  You can feel the IUD in your vagina.  You think you may be pregnant, or you miss your menstrual period.  You think you may have an STI (sexually transmitted infection). Get help right away if:  You have flu-like symptoms.  You have a fever and chills.  You can feel that your IUD has slipped out of place. Summary  After the procedure, it is common to have cramps and pain in the abdomen. It is also common to have light bleeding (spotting) or heavier bleeding that is like your menstrual period.  Continue to check that the IUD is still in place by feeling for the string(s) after every menstrual period, or once a month.  Keep all follow-up visits as told by your health care provider. This is important.  Contact your health care provider if you have problems with your IUD string(s), such as the string getting longer or bothering you or your sexual partner. This information is not intended to replace advice given to you by your health care provider. Make sure you discuss any questions you have with your health care provider. Document Released: 08/01/2011 Document Revised: 11/15/2017 Document Reviewed: 10/24/2016 Elsevier Patient Education  2020 [employer]  

## 2019-08-24 NOTE — H&P (Signed)
Wanda Porter is a 23 y.o. female G100P1011 with IUP at 75w0dpresenting for ROM . Pt states she has been having irregular, every 3-5 minutes contractions, associated with scant staining vaginal bleeding for 2 hours..  Membranes are ruptured, clear fluid at noon, started contractions afterwards with active fetal movement.   [facility] Prenatal History/Complications:  Hx [id number] at 38.3 weeks, preeclampsia  Past Medical History: Past Medical History:  Diagnosis Date  . Pre-eclampsia     Past Surgical History: Past Surgical History:  Procedure Laterality Date  . NO PAST SURGERIES      Obstetrical History: OB History    Gravida  3   Para  1   Term  1   Preterm  0   AB  1   Living  1     SAB  0   TAB  1   Ectopic  0   Multiple  0   Live Births  1            Social History: Social History   Socioeconomic History  . Marital status: Single    Spouse name: Not on file  . Number of children: Not on file  . Years of education: Not on file  . Highest education level: Not on file  Occupational History  . Not on file  Social Needs  . Financial resource strain: Not on file  . Food insecurity    Worry: Not on file    Inability: Not on file  . Transportation needs    Medical: Not on file    Non-medical: Not on file  Tobacco Use  . Smoking status: Current Some Day Smoker    Packs/day: 0.25    Types: Cigarettes  . Smokeless tobacco: Never Used  Substance and Sexual Activity  . Alcohol use: No  . Drug use: No  . Sexual activity: Yes    Birth control/protection: None  Lifestyle  . Physical activity    Days per week: Not on file    Minutes per session: Not on file  . Stress: Not on file  Relationships  . Social [place] phone: Not on file    Gets together: Not on file    Attends religious service: Not on file    Active member of club or organization: Not on file    Attends meetings of clubs or  organizations: Not on file    Relationship status: Not on file  Other Topics Concern  . Not on file  Social History Narrative  . Not on file    Family History: Family History  Problem Relation Age of Onset  . Diabetes Other   . Hypertension Other   . Diabetes Father   . Hypertension Maternal Grandmother     Allergies: No Known Allergies  Medications Prior to Admission  Medication Sig Dispense Refill Last Dose  . acetaminophen (TYLENOL) 325 MG tablet Take 325 mg by mouth every 6 (six) hours as needed for mild pain.     . Blood Pressure Monitoring (BLOOD PRESSURE KIT) DEVI 1 Device by Does not apply route as needed. ICD 10 :  Z34.90 1 Device 0   . Prenatal Vit-Fe Fumarate-FA (MULTIVITAMIN-PRENATAL) 27-0.8 MG TABS tablet Take 1 tablet by mouth at bedtime.            Review of Systems   Constitutional: Negative for fever and chills Eyes: Negative for visual disturbances Respiratory: Negative for shortness  of breath, dyspnea Cardiovascular: Negative for chest pain or palpitations  Gastrointestinal: Negative for vomiting, diarrhea and constipation.  POSITIVE for abdominal pain (contractions) Genitourinary: Negative for dysuria and urgency Musculoskeletal: Negative for back pain, joint pain, myalgias  Neurological: Negative for dizziness and headaches      Blood pressure 136/72, pulse (!) 102, temperature 98.8 F (37.1 C), temperature source Oral, resp. rate 16, height _0  (1.549 m), weight 67 kg, last menstrual period 11/24/2018, SpO2 100 %, unknown if currently breastfeeding. General appearance: alert, cooperative and no distress Lungs: normal respiratory effort Heart: regular rate and rhythm Abdomen: soft, non-tender; bowel sounds normal Extremities: Homans sign is negative, no sign of DVT DTR's 2+ Presentation: cephalic Fetal monitoring  Baseline: 150 bpm, Variability: Good {> 6 bpm), Accelerations: Reactive and Decelerations: Absent Uterine activity  3  minutes Dilation: 4 Effacement (%): 70 Station: -2 Exam by:: Daneil Dan, RN   Prenatal labs: ABO, Rh: --/Positive/-- (03/08 0000) Antibody: Negative (02/06 0000) Rubella: Immune (02/06 0000) RPR: Nonreactive (02/06 0000)  HBsAg: Negative (02/06 0000)  HIV: Non-reactive (02/06 0000)  GBS: Negative (08/21 0911)      Nursing Staff Provider  Office Location [place]  Dating   LMP consistent with early [place]  Language  English Anatomy US   Normal  Flu Vaccine   Genetic Screen  NIPS: neg    TDaP vaccine    Hgb A1C or  GTT  Third trimester 9  Rhogam   N/A   LAB RESULTS   Feeding Plan  Breast/Bottle Blood Type --O /Positive/-- (03/08 0000)   Contraception  Depo Antibody Negative (02/06 0000)  Circumcision  NA Rubella Immune (02/06 0000)  Pediatrician   TAPM RPR Nonreactive (02/06 0000)   Support Person  Timothy HBsAg Negative (02/06 0000)   Prenatal Classes  offered HIV Non-reactive (02/06 0000)  BTL Consent  N/A GBS  Negative   VBAC Consent  n/a Pap Normal 2/20    Hgb Electro  neg  BP [place] [facility]     SMA     Waterbirth  _1  Class _2  Consent _3  CNM visit    Prenatal Transfer Tool  Maternal Diabetes: No Genetic Screening: Normal Maternal Ultrasounds/Referrals: Normal Fetal Ultrasounds or other Referrals:  None Maternal Substance Abuse:  No Significant Maternal Medications:  None Significant Maternal Lab Results: Group B Strep negative     Results for orders placed or performed during the hospital encounter of 08/24/19 (from the past 24 hour(s))  Fern Test   Collection Time: 08/24/19  1:30 PM  Result Value Ref Range   POCT Fern Test Positive = ruptured amniotic membanes   POCT fern test   Collection Time: 08/24/19  1:47 PM  Result Value Ref Range   POCT Fern Test Positive = ruptured amniotic membanes   CBC   Collection Time: 08/24/19  2:02 PM  Result Value Ref Range   WBC 8.2 4.0 - 10.5 K/uL   RBC 4.26 3.87 - 5.11 MIL/uL   Hemoglobin 12.4  12.0 - 15.0 g/dL   HCT 37.6 36.0 - 46.0 %   MCV 88.3 80.0 - 100.0 fL   MCH 29.1 26.0 - 34.0 pg   MCHC 33.0 30.0 - 36.0 g/dL   RDW 13.6 11.5 - 15.5 %   Platelets 200 150 - 400 K/uL   nRBC 0.0 0.0 - 0.2 %    Assessment: Wanda Porter is a 23 y.o. G3P1011 with an IUP at 66w0dpresenting for ROM/early  labor  Plan: #Labor: expectant management #Pain:  Per request #FWB Cat 1   Christin Fudge 08/24/2019, 2:26 PM

## 2019-08-25 DIAGNOSIS — Z975 Presence of (intrauterine) contraceptive device: Secondary | ICD-10-CM

## 2019-08-25 LAB — RPR: RPR Ser Ql: NONREACTIVE

## 2019-08-25 MED ORDER — IBUPROFEN 600 MG PO TABS: 600.0000 mg | ORAL_TABLET | Freq: Four times a day (QID) | ORAL | 0 refills | Status: DC

## 2019-08-25 NOTE — Lactation Note (Signed)
This note was copied from a baby's chart. Lactation Consultation Note Baby is 62 hrs old. Has given more formula than BF. [place] doesn't feel that mom is to interested in BF. Mom told RN that the baby would be getting formula when the cluster feeding starts. Mom has [place], requested Similac formula instead of Gerber formula. Mom didn't Bf her first child. Mom encouraged to feed baby 8-12 times/24 hours and with feeding cues.  Encouraged mom to BF before giving formula. Mom wants to leave as soon as the baby's 24 hr test are completed. LC first went to the room, mom wasn't in room she had went to the vending machine. Mom is also a smoker. LC saw mom in hall, then went to her room. Mom was slightly stand offish.  LC reviewed engorgement.  Encouraged mom to call for assistance or questions.  Patient Name: Girl [place] Noori [id number] Date: 08/25/2019 Reason for consult: Initial assessment;1st time breastfeeding;Term   Maternal Data Does the patient have breastfeeding experience prior to this delivery?: No  Feeding    LATCH Score                   Interventions    Lactation Tools Discussed/Used WIC Program: Yes   Consult Status Consult Status: Complete Date: 08/25/19    Theodoro Kalata 08/25/2019, 3:35 AM

## 2019-08-25 NOTE — Progress Notes (Addendum)
Post Partum Day 1 Subjective: no complaints, up ad lib, voiding and tolerating PO, small lochia, plans to breastfeed, plans to bottle feed, IUD, pain controlled. Denies Ha, CP, SOB.  Objective: Blood pressure 113/73, pulse 79, temperature 98.2 F (36.8 C), temperature source Oral, resp. rate 18, height 5\' 1"  (1.549 m), weight 67 kg, last menstrual period 11/24/2018, SpO2 100 %, unknown if currently breastfeeding.  Physical Exam:  General: alert, cooperative and no distress Lochia:normal flow Chest: CTAB Heart: RRR no m/r/g Abdomen: +BS, soft, nontender,  Uterine Fundus: firm DVT Evaluation: No evidence of DVT seen on physical exam. Extremities: no edema  Recent Labs    08/24/19 1402  HGB 12.4  HCT 37.6    Assessment/Plan: Discharge home possibly today at 24 hours pending dc of infant, if not plans for dc tomorrow Pain is well controlled, mom/baby bonding well.    LOS: 1 day   Nikki Dom Laurina Fischl 06/22/8241, 3:53 AM

## 2019-08-26 ENCOUNTER — Encounter: Payer: Self-pay | Admitting: General Practice

## 2019-08-27 ENCOUNTER — Encounter: Payer: Medicaid Other | Admitting: Obstetrics & Gynecology

## 2019-08-28 ENCOUNTER — Encounter: Payer: Medicaid Other | Admitting: Obstetrics & Gynecology

## 2019-09-04 ENCOUNTER — Encounter: Payer: Medicaid Other | Admitting: Obstetrics and Gynecology

## 2019-09-21 ENCOUNTER — Telehealth: Payer: Self-pay | Admitting: Family Medicine

## 2019-09-21 NOTE — Telephone Encounter (Signed)
The patient called in stating she can only do appointments at [facility] or on Friday's. She cant make the appointment tomorrow however she would like her birth control method removed. She stated she had an iud placed at the hospital and it's giving her problems. She stated she wants it removed immediately and she would like to know if she visits the hospital and tell them we cant see her, will they remove it? Informed the patient I will send a message to the nurse to give advice on what options she has available.

## 2019-09-22 ENCOUNTER — Ambulatory Visit: Payer: Medicaid Other | Admitting: Student

## 2019-09-22 ENCOUNTER — Telehealth (INDEPENDENT_AMBULATORY_CARE_PROVIDER_SITE_OTHER): Payer: Medicaid Other

## 2019-09-22 ENCOUNTER — Telehealth: Payer: Self-pay | Admitting: Family Medicine

## 2019-09-22 DIAGNOSIS — Z975 Presence of (intrauterine) contraceptive device: Secondary | ICD-10-CM

## 2019-09-22 NOTE — Telephone Encounter (Signed)
Called the patient back regarding a message sent from the nurse line. She stated yesterday she would like to have the birth control removed and if we cant see her she will go to the hospital. Informed the patient of the appointments we have available. She stated she could only come on certain days at certain times due to her work schedule. The patient was scheduled according to the time she could come into the office.   Left a voicemail informing the patient to call our office at her earliest convienance for other available options with her upcoming appointment.    If the patient is more flexible to the times she is available we can possibly schedule her earlier.

## 2019-09-22 NOTE — Telephone Encounter (Signed)
Pt spoke with front office staff on 10/5 and expressed concerns about IUD.   Called pt to follow up. Pt states she wants the IUD taken out immediately. IUD was placed on 9/7 following vaginal delivery. Pt states the strings were bothering her so she cut them. Educated pt about purpose of strings remaining long until follow up. Pt reports minimal spotting and denies any pain. Pt states the strings are the only discomfort she is experiencing. I informed pt she will need to come to our next available provider appt to have the IUD removed and to discuss changing birth control methods. Pt would like to discuss beginning Depo Provera injections.   Pt states she is unable to talk any longer and would like the office to call her back to set up an appt. Will send message to front office to schedule appt.

## 2019-10-01 ENCOUNTER — Telehealth: Payer: Self-pay

## 2019-10-01 NOTE — Telephone Encounter (Addendum)
Pt called the nurses station stating that she has been trying to call and get her IUD taken out because it has been bothering her causing itching and pain.   Pt also reports that she can feel it.   I informed pt that we can see her on 10/05/19 @ 1035.  Pt asked if she came early would she be seen.  I explained to the pt that I can not promise that she would be seen early because pt's have appts scheduled prior to her appt.  Pt asked if she can get something else in place of the IUD.  I informed the pt that if she knows what she wants then it would make her appt move a little smother. Pt verbalized that she wants to get the Depo.  I informed pt that she would be able to get that after the removal of her IUD.  Pt verbalized understanding with no further questions.   Mel Almond, RN 10/01/19

## 2019-10-05 ENCOUNTER — Ambulatory Visit (INDEPENDENT_AMBULATORY_CARE_PROVIDER_SITE_OTHER): Payer: Medicaid Other | Admitting: Obstetrics and Gynecology

## 2019-10-05 ENCOUNTER — Other Ambulatory Visit: Payer: Self-pay

## 2019-10-05 ENCOUNTER — Encounter: Payer: Self-pay | Admitting: Family Medicine

## 2019-10-05 ENCOUNTER — Encounter: Payer: Self-pay | Admitting: Obstetrics and Gynecology

## 2019-10-05 VITALS — BP 132/85 | HR 80 | Wt 126.3 lb

## 2019-10-05 DIAGNOSIS — Z30432 Encounter for removal of intrauterine contraceptive device: Secondary | ICD-10-CM | POA: Diagnosis not present

## 2019-10-05 DIAGNOSIS — Z3009 Encounter for other general counseling and advice on contraception: Secondary | ICD-10-CM | POA: Diagnosis not present

## 2019-10-05 MED ORDER — MEDROXYPROGESTERONE ACETATE 150 MG/ML IM SUSP
150.0000 mg | Freq: Once | INTRAMUSCULAR | Status: AC
  Administered 2019-10-05: 12:00:00 150 mg via INTRAMUSCULAR

## 2019-10-05 NOTE — Progress Notes (Signed)
   GYNECOLOGY OFFICE NOTE  History:  23 y.o. [id number] here today for follow up for IUD removal. Has irregular bleeding and "excruciating" cramping since it was placed. Liletta placed 08/24/19 after delivery (done post placentally) and has been unhappy with it since. States flow is much heavier than it has been in the past. She has had depo in the past and was very happy with that, would like to go back to depo.  Past Medical History:  Diagnosis Date  . Pre-eclampsia    Past Surgical History:  Procedure Laterality Date  . NO PAST SURGERIES      Current Outpatient Medications:  .  ibuprofen (ADVIL) 600 MG tablet, Take 1 tablet (600 mg total) by mouth every 6 (six) hours. (Patient not taking: Reported on 10/05/2019), Disp: 30 tablet, Rfl: 0 .  Prenatal Vit-Fe Fumarate-FA (MULTIVITAMIN-PRENATAL) 27-0.8 MG TABS tablet, Take 1 tablet by mouth at bedtime. , Disp: , Rfl:   Current Facility-Administered Medications:  .  medroxyPROGESTERone (DEPO-PROVERA) injection 150 mg, 150 mg, Intramuscular, Once, Sloan Leiter, MD  The following portions of the patient's history were reviewed and updated as appropriate: allergies, current medications, past family history, past medical history, past social history, past surgical history and problem list.   Review of Systems:  Pertinent items noted in HPI and remainder of comprehensive ROS otherwise negative.   Objective:  Physical Exam BP 132/85   Pulse 80   Wt 126 lb 4.8 oz (57.3 kg)   LMP 11/24/2018 (Exact Date)   BMI 23.86 kg/m  CONSTITUTIONAL: Well-developed, well-nourished female in no acute distress HENT:  Normocephalic, atraumatic. External right and left ear normal. Oropharynx is clear and moist EYES: Conjunctivae and EOM are normal. Pupils are equal, round, and reactive to light. No scleral icterus.  NECK: Normal range of motion, supple, no masses SKIN: Skin is warm and dry. No rash noted. Not diaphoretic. No erythema. No pallor. NEUROLOGIC:  Alert and oriented to person, place, and time. Normal reflexes, muscle tone coordination. No cranial nerve deficit noted. PSYCHIATRIC: Normal mood and affect. Normal behavior. Normal judgment and thought content. CARDIOVASCULAR: Normal heart rate noted RESPIRATORY: Effort normal, no problems with respiration noted ABDOMEN: Soft, no distention noted.   PELVIC: Normal appearing external genitalia; normal appearing vaginal mucosa and cervix.  No abnormal discharge noted.  IUD removed intact MUSCULOSKELETAL: Normal range of motion. No edema noted.  Exam done with chaperone present.  Labs and Imaging No results found.  Assessment & Plan:   1. Encounter for IUD removal Removed easily, see procedure note  2. Encounter for counseling regarding contraception Desires to restart depo  Routine preventative health maintenance measures emphasized. Please refer to After Visit Summary for other counseling recommendations.   Return in about 3 months (around 01/05/2020) for depo.  Total face-to-face time with patient: 15 minutes. Over 50% of encounter was spent on counseling and coordination of care.  Feliz Beam, M.D. Attending Center for [employer] farm manager)

## 2019-10-05 NOTE — Progress Notes (Signed)
   IUD Removal Procedure Note   Patient is 23 y.o. [id number] who is here for Kaplan IUD removal. She would like IUD removed secondary to pain and heavy bleeding. She would like to switch to depo-provera. She has no other complaints today. Reviewed risks of removal including pain, bleeding, difficult removal and inability to remove IUD which may require hysteroscopic removal in OR. She affirms that she would like IUD removed.  BP 132/85   Pulse 80   Wt 126 lb 4.8 oz (57.3 kg)   LMP 11/24/2018 (Exact Date)   BMI 23.86 kg/m   Patient with normal appearing external female genitalia. Graves speculum placed in vagina and Liletta IUD strings easily visualized. Strings grasped with ring forceps and removed easily. Minimal bleeding noted. All instruments removed from vagina. Patient tolerated procedure very well.    She was given post removal instructions. She is planning on using depo for contraception.   Return 1 year for annual or prn.    Feliz Beam, M.D. Attending Center for [employer] Fish farm manager)

## 2019-10-15 ENCOUNTER — Ambulatory Visit: Payer: Medicaid Other | Admitting: Obstetrics and Gynecology

## 2020-01-05 ENCOUNTER — Other Ambulatory Visit: Payer: Self-pay

## 2020-01-05 ENCOUNTER — Other Ambulatory Visit (HOSPITAL_COMMUNITY)
Admission: RE | Admit: 2020-01-05 | Discharge: 2020-01-05 | Disposition: A | Discharge status: Home / Self Care | Payer: Medicaid Other | Source: Ambulatory Visit | Attending: Obstetrics & Gynecology | Admitting: Obstetrics & Gynecology

## 2020-01-05 ENCOUNTER — Ambulatory Visit (INDEPENDENT_AMBULATORY_CARE_PROVIDER_SITE_OTHER): Payer: Medicaid Other

## 2020-01-05 VITALS — BP 127/85 | HR 71 | Wt 116.1 lb

## 2020-01-05 DIAGNOSIS — Z113 Encounter for screening for infections with a predominantly sexual mode of transmission: Secondary | ICD-10-CM

## 2020-01-05 DIAGNOSIS — N760 Acute vaginitis: Secondary | ICD-10-CM

## 2020-01-05 DIAGNOSIS — B9689 Other specified bacterial agents as the cause of diseases classified elsewhere: Secondary | ICD-10-CM

## 2020-01-05 DIAGNOSIS — Z3042 Encounter for surveillance of injectable contraceptive: Secondary | ICD-10-CM | POA: Diagnosis not present

## 2020-01-05 MED ORDER — MEDROXYPROGESTERONE ACETATE 150 MG/ML IM SUSP
150.0000 mg | Freq: Once | INTRAMUSCULAR | Status: AC
  Administered 2020-01-05: 09:00:00 150 mg via INTRAMUSCULAR

## 2020-01-05 NOTE — Progress Notes (Signed)
Patient seen and assessed by nursing staff during this encounter. I have reviewed the chart and agree with the documentation and plan.  Jaynie Collins, MD 01/05/2020 12:07 PM

## 2020-01-05 NOTE — Progress Notes (Signed)
Wanda Porter here for Depo-Provera  Injection.  Injection administered without complication. Patient will return in 3 months for next injection.  Pt requesting STD testing; would like vaginal swab and lab work. Self-swab instructions given and specimen obtained. Labs drawn. Explained we will call with any abnormal results.   Marjo Bicker, RN 01/05/2020  8:43 AM

## 2020-01-06 LAB — HEPATITIS C ANTIBODY: Hep C Virus Ab: <0.1 s/co ratio (ref 0.0–0.9)

## 2020-01-06 LAB — RPR: RPR Ser Ql: NONREACTIVE

## 2020-01-06 LAB — HIV ANTIBODY (ROUTINE TESTING W REFLEX): HIV Screen 4th Generation wRfx: NONREACTIVE

## 2020-01-06 LAB — HEPATITIS B SURFACE ANTIGEN: Hepatitis B Surface Ag: NEGATIVE

## 2020-01-13 LAB — CERVICOVAGINAL ANCILLARY ONLY
Bacterial Vaginitis (gardnerella): POSITIVE — AB
Chlamydia: NEGATIVE
Comment: NEGATIVE
Comment: NEGATIVE
Comment: NEGATIVE
Comment: NEGATIVE
Comment: NEGATIVE
Comment: NORMAL
Neisseria Gonorrhea: NEGATIVE

## 2020-01-13 MED ORDER — METRONIDAZOLE 500 MG PO TABS: 500.0000 mg | ORAL_TABLET | Freq: Two times a day (BID) | ORAL | 0 refills | Status: DC

## 2020-01-13 NOTE — Addendum Note (Signed)
Addended by: Jaynie Collins A on: 01/13/2020 11:09 PM   Modules accepted: Orders

## 2020-04-04 ENCOUNTER — Ambulatory Visit: Payer: Medicaid Other

## 2020-07-26 ENCOUNTER — Encounter: Payer: Self-pay | Admitting: Medical

## 2020-08-25 ENCOUNTER — Ambulatory Visit: Payer: Medicaid Other | Admitting: Nurse Practitioner

## 2020-11-14 ENCOUNTER — Other Ambulatory Visit: Payer: Self-pay

## 2020-11-14 ENCOUNTER — Ambulatory Visit (INDEPENDENT_AMBULATORY_CARE_PROVIDER_SITE_OTHER): Payer: Medicaid Other | Admitting: *Deleted

## 2020-11-14 DIAGNOSIS — Z3201 Encounter for pregnancy test, result positive: Secondary | ICD-10-CM | POA: Diagnosis not present

## 2020-11-14 DIAGNOSIS — Z3480 Encounter for supervision of other normal pregnancy, unspecified trimester: Secondary | ICD-10-CM

## 2020-11-14 DIAGNOSIS — Z3687 Encounter for antenatal screening for uncertain dates: Secondary | ICD-10-CM

## 2020-11-14 LAB — POCT PREGNANCY, URINE: Preg Test, Ur: POSITIVE — AB

## 2020-11-14 MED ORDER — COMPLETENATE 29-1 MG PO CHEW: 1.0000 | CHEWABLE_TABLET | Freq: Every day | ORAL | 11 refills | Status: DC

## 2020-11-14 NOTE — Progress Notes (Addendum)
Patient dropped off urine for pregnancy test which was positive. I called Wanda Porter and she States unsure of LMP- sometime in October , maybe middle of October- states could have been a few days before or after ; states has had multiple irregular periods since she stopped depo-provera a few months ago. States the one she had in October was a normal period. Request dating [place] because she states she really isn't sure. Dating [place] ordered for first available appointment 11/22/20 at 11:00 . Instructed patient after she has [place] and EDD confirmed we recommend she start prenatal care. Also reviewed meds and sent in RX for prenatal vitamins. Advised to start prenatal care with provider of her choice once she knows EDD. She hopes to start care with [place] as she has been seen by [place] before; advised to call registrar to schedule once EDD determined. She voices understanding. Addendum: at time of visit Nurse was in office at [facility] patient was at home.  Wanda Malkiewicz,RN

## 2020-11-14 NOTE — Progress Notes (Signed)
Agree with A & P. 

## 2020-11-22 ENCOUNTER — Ambulatory Visit
Admission: RE | Admit: 2020-11-22 | Discharge: 2020-11-22 | Disposition: A | Discharge status: Home / Self Care | Payer: Medicaid Other | Source: Ambulatory Visit | Attending: Obstetrics and Gynecology | Admitting: Obstetrics and Gynecology

## 2020-11-22 ENCOUNTER — Other Ambulatory Visit: Payer: Self-pay | Admitting: Obstetrics and Gynecology

## 2020-11-22 ENCOUNTER — Telehealth: Payer: Self-pay | Admitting: Medical

## 2020-11-22 ENCOUNTER — Other Ambulatory Visit: Payer: Self-pay

## 2020-11-22 DIAGNOSIS — Z3687 Encounter for antenatal screening for uncertain dates: Secondary | ICD-10-CM

## 2020-11-22 DIAGNOSIS — Z3480 Encounter for supervision of other normal pregnancy, unspecified trimester: Secondary | ICD-10-CM

## 2020-11-22 NOTE — Telephone Encounter (Signed)
I called Wanda Porter today at 12:59 PM and confirmed patient's identity using two patient identifiers. [place] results from earlier today were reviewed. Patient is not yet scheduled for new OB visit. Planning to get prenatal care at [facility]. Inbasket messages sent to schedule in 3-5 weeks. First trimester warning signs reviewed. Patient voiced understanding and had no further questions.   US OB Comp Less 14 Wks  Result Date: 11/22/2020 CLINICAL DATA:  Unsure last menstrual period. EXAM: OBSTETRIC <14 WK ULTRASOUND TECHNIQUE: Transabdominal ultrasound was performed for evaluation of the gestation as well as the maternal uterus and adnexal regions. COMPARISON:  None. FINDINGS: Intrauterine gestational sac: Single Yolk sac:  Visualized. Embryo:  Visualized. Cardiac Activity: Visualized. Heart Rate: 144 bpm MSD:    mm    w     d CRL:   10.1 mm   7 w 1 d                  [place] EDC: 07/10/2021 Subchorionic hemorrhage:  None visualized. Maternal uterus/adnexae: No adnexal mass or free fluid. The patient declined transvaginal imaging. IMPRESSION: Seven week 1 day intrauterine pregnancy. Fetal heart rate 144 beats per minute. No acute maternal findings. Electronically Signed   By: Charlett Nose M.D.   On: 11/22/2020 12:25    Vonzella Nipple, PA-C 11/22/2020 12:59 PM

## 2020-12-20 ENCOUNTER — Other Ambulatory Visit (HOSPITAL_COMMUNITY)
Admission: RE | Admit: 2020-12-20 | Discharge: 2020-12-20 | Disposition: A | Discharge status: Home / Self Care | Payer: Medicaid Other | Source: Ambulatory Visit | Attending: Advanced Practice Midwife | Admitting: Advanced Practice Midwife

## 2020-12-20 ENCOUNTER — Ambulatory Visit (INDEPENDENT_AMBULATORY_CARE_PROVIDER_SITE_OTHER): Payer: Medicaid Other | Admitting: Advanced Practice Midwife

## 2020-12-20 ENCOUNTER — Other Ambulatory Visit: Payer: Self-pay

## 2020-12-20 ENCOUNTER — Encounter: Payer: Self-pay | Admitting: *Deleted

## 2020-12-20 ENCOUNTER — Encounter: Payer: Self-pay | Admitting: Advanced Practice Midwife

## 2020-12-20 VITALS — BP 114/75 | HR 78 | Wt 117.6 lb

## 2020-12-20 DIAGNOSIS — Z3491 Encounter for supervision of normal pregnancy, unspecified, first trimester: Secondary | ICD-10-CM | POA: Diagnosis not present

## 2020-12-20 DIAGNOSIS — O09299 Supervision of pregnancy with other poor reproductive or obstetric history, unspecified trimester: Secondary | ICD-10-CM

## 2020-12-20 DIAGNOSIS — O26893 Other specified pregnancy related conditions, third trimester: Secondary | ICD-10-CM

## 2020-12-20 DIAGNOSIS — O09291 Supervision of pregnancy with other poor reproductive or obstetric history, first trimester: Secondary | ICD-10-CM

## 2020-12-20 DIAGNOSIS — O26891 Other specified pregnancy related conditions, first trimester: Secondary | ICD-10-CM

## 2020-12-20 DIAGNOSIS — R519 Headache, unspecified: Secondary | ICD-10-CM | POA: Diagnosis not present

## 2020-12-20 DIAGNOSIS — Z3A11 11 weeks gestation of pregnancy: Secondary | ICD-10-CM | POA: Diagnosis not present

## 2020-12-20 DIAGNOSIS — Z3490 Encounter for supervision of normal pregnancy, unspecified, unspecified trimester: Secondary | ICD-10-CM | POA: Insufficient documentation

## 2020-12-20 MED ORDER — ASPIRIN EC 81 MG PO TBEC: 81.0000 mg | DELAYED_RELEASE_TABLET | Freq: Every day | ORAL | 2 refills | Status: DC

## 2020-12-20 NOTE — Patient Instructions (Signed)
Considering Waterbirth? Guide for patients at [facility] ([facility]) Why consider waterbirth? . Gentle birth for babies  . Less pain medicine used in labor  . May allow for passive descent/less pushing  . May reduce perineal tears  . More mobility and instinctive maternal position changes  . Increased maternal relaxation   Is waterbirth safe? What are the risks of infection, drowning or other complications? . Infection:  . Very low risk (3.7 % for tub vs 4.8% for bed)  . 7 in 8000 waterbirths with documented infection  . Poorly cleaned equipment most common cause  . Slightly lower group B strep transmission rate  . Drowning  . Maternal:  . Very low risk  . Related to seizures or fainting  . Newborn:  . Very low risk. No evidence of increased risk of respiratory problems in multiple large studies  . Physiological protection from breathing under water  . Avoid underwater birth if there are any fetal complications  . Once baby's head is out of the water, keep it out.  . Birth complication  . Some reports of cord trauma, but risk decreased by bringing baby to surface gradually  . No evidence of increased risk of shoulder dystocia. Mothers can usually change positions faster in water than in a bed, possibly aiding the maneuvers to free the shoulder.   There are 2 things you MUST do to have a waterbirth with [facility]: 1. Attend a waterbirth class at [facility]   a. 3rd Wednesday of every month from 7-9 pm (virtual during COVID) b. Free c. Register by calling [phone] or register online at [email or web address] d. Bring us the certificate from the class to your prenatal appointment or send via MyChart 2. Meet with a midwife at 36 weeks* to see if you can still plan a waterbirth and to sign the consent.   *We also recommend that you schedule as many of your prenatal visits with a midwife as possible.    Helpful information: . You may  want to bring a bathing suit top to the hospital to wear during labor but this is optional.  All other supplies are provided by the hospital. . Please arrive at the hospital with signs of active labor, and do not wait at home until late in labor. It takes 45 min- 2 hours for COVID testing, fetal monitoring, and check in to your room to take place, plus transport and filling of the waterbirth tub.    Things that would prevent you from having a waterbirth: . Unknown or Positive COVID-19 diagnosis upon admission to hospital* . Premature, <37wks  . Previous cesarean birth  . Presence of thick meconium-stained fluid  . Multiple gestation (Twins, triplets, etc.)  . Uncontrolled diabetes or gestational diabetes requiring medication  . Hypertension diagnosed in pregnancy or preexisting hypertension (gestational hypertension, preeclampsia, or chronic hypertension) . Heavy vaginal bleeding  . Non-reassuring fetal heart rate  . Active infection (MRSA, etc.). Group B Strep is NOT a contraindication for waterbirth.  . If your labor has to be induced and induction method requires continuous monitoring of the baby's heart rate  . Other risks/issues identified by your obstetrical provider   Please remember that birth is unpredictable. Under certain unforeseeable circumstances your provider may advise against giving birth in the tub. These decisions will be made on a case-by-case basis and with the safety of you and your baby as our highest priority.   *Please remember that in order   to have a waterbirth, you must test Negative to COVID-19 upon admission to the hospital.  Updated 11/01/2020   DOULA LIST   [employer] Doula  [place]  [phone]  [email or web address]  [email or web address]  Zula the [employer] [phone]  [email or web address]   [employer]   Precious Danford Bad   [email or web address]   ??THE MOTHERLY  DOULA?? Zola Button   [phone]   [email or web address]     [employer] Doula  Olive Bass  [phone]    [email or web address]   [employer] [facility]  Angie Rosier     [phone]     [email or web address]   Renato Gails: Doula & Photographer   Renato Gails [phone]       [email or web address]  [email or web address]   [employer] Doula [employer]  [place] [phone]   [email or web address]   [place], [place]  Lolita Rieger  [phone]  [email or web address]   [email or web address]   [facility]   Kizzie Furnish   [phone]  [email or web address]   Dina Rich Mucarabones Doula  Dina Rich  [phone] [email or web address]  Natural Baby Doulas  Cornelious Bryant         [facility]       Lora Reynolds     [phone] [email or web address]  [email or web address]   [facility]   [place] [phone] [email or web address]   [facility] Doula Services  Camelia Eng     [phone]  [email or web address]  [facility]     [phone]  [email or web address]  Facebook [email or web address] IG [email or web address] .  [phone] [email or web address] .[phone] [email or web address]   [phone]  [phone] [email or web address]     [facility]  [phone] [email or web address]   IG [email or web address]    [phone]    [phone] [email or web address]   Lurline Hare "Senoia" Free  [phone] [email or web address]    Mtende Roll  [phone] [email or web address]   Susie Williams   [email or web address]    [phone]    [phone] [email or web address]     Denita Lung  [phone] [email or web address] Galloway  [phone]  [email or web address]    Caldwell Rhem    [phone]   [facility] Red wing  [phone] [email or web address]  Doula Mama [phone] [email or web address]  [facility] Maryjean Ka  [phone] [email or web address]  [facility] [phone]  [email or web address]  [email or web address] Montgomery Harris-Jones  [phone] [email or web address]   Allen Kell [phone] [email or web address] .Ardine Eng   [phone] [email or web address]   Gilda Crease  [phone] [email or web address]  [employer]  Jonelle Sports  [phone] [email or web address]   Jackelyn Poling [phone] [email or web address]  MOOR Divine Myeka Dunn  [email or web address]   Cristina Gong [phone] [email or web address]   Glory Buff [phone] [email or web address]   Whitney Muse [phone] [email or web address]

## 2020-12-20 NOTE — Progress Notes (Signed)
History:   Wanda Porter is a 25 y.o. [id number] at 81w1dby 7 weeks ultrasound being seen today for her first obstetrical visit.  Her obstetrical history is significant for pre-eclampsia and former smoker. Patient does not intend to breast feed. Pregnancy history fully reviewed.   Patient identifies same partner as previous pregnancies, he has sickle cell disease.   Patient reports no complaints. She desires waterbirth. She declines flu immunization. She declines discussion of COVID vaccine but verbalizes understanding that it is recommended by [facility] and [facility].   HISTORY: OB History  Gravida Para Term Preterm AB Living  _0 0 1 2  SAB IAB Ectopic Multiple Live Births  0 1 0 0 2    # Outcome Date GA Lbr Len/2nd Weight Sex Delivery Anes PTL Lv  4 Current           3 Term 08/24/19 37w0d4:40 / 00:03 6 lb 11.9 oz (3.059 kg) F Vag-Spont None  LIV     Birth Comments: WNL     Name: Lachapelle,GIRL Alletta     Apgar1: 8  Apgar5: 9  2 Term 02/10/18 383w3d lb 14.2 oz (2.67 kg) M Vag-Spont EPI  LIV  1 IAB 2012            Last pap smear was done 01/2019 and was normal  Past Medical History:  Diagnosis Date  . Irregular fetal heart rate 07/04/2019  . Pre-eclampsia   . Single spontaneous delivery 08/25/2019  . Supervision of low-risk pregnancy 06/25/2019    Nursing Staff Provider Office Location [facility]   LMP consistent with early US Koreanguage  English Anatomy US KoreaNormal Flu Vaccine   Genetic Screen  NIPS:   AFP:   First Screen:  Quad:   TDaP vaccine    Hgb A1C or  GTT Early  Third trimester  Rhogam   N/A   LAB RESULTS  Feeding Plan  Breast/Bottle Blood Type --/Positive/-- (03/08 0000)  Contraception  Depo Antibody Negative (02/06 0000) Cir   Past Surgical History:  Procedure Laterality Date  . NO PAST SURGERIES     Family History  Problem Relation Age of Onset  . Diabetes Other   . Hypertension Other   . Diabetes Father   . Hypertension Maternal Grandmother    Social  History   Tobacco Use  . Smoking status: Current Every Day Smoker    Packs/day: 0.25    Types: Cigarettes  . Smokeless tobacco: Never Used  Vaping Use  . Vaping Use: Never used  Substance Use Topics  . Alcohol use: No  . Drug use: No   No Known Allergies Current Outpatient Medications on File Prior to Visit  Medication Sig Dispense Refill  . prenatal vitamin w/FE, FA (NATACHEW) 29-1 MG CHEW chewable tablet Chew 1 tablet by mouth daily at 12 noon. 30 tablet 11   No current facility-administered medications on file prior to visit.    Review of Systems Pertinent items noted in HPI and remainder of comprehensive ROS otherwise negative.  Physical Exam:   Vitals:   12/20/20 1018  BP: 114/75  Pulse: 78  Weight: 117 lb 9.6 oz (53.3 kg)   Fetal Heart Rate (bpm): 165 Bedside Ultrasound for FHR check: Viable intrauterine pregnancy with positive cardiac activity noted, fetal heart rate 165 bpm Patient informed that the ultrasound is considered a limited obstetric ultrasound and is not intended to be a complete ultrasound exam.  Patient also  informed that the ultrasound is not being completed with the intent of assessing for fetal or placental anomalies or any pelvic abnormalities.  Explained that the purpose of today's ultrasound is to assess for fetal heart rate.  Patient acknowledges the purpose of the exam and the limitations of the study. General: well-developed, well-nourished female in no acute distress  Breasts:  Declined by patient  Skin: normal coloration and turgor, no rashes  Neurologic: oriented, normal, negative, normal mood  Extremities: normal strength, tone, and muscle mass, ROM of all joints is normal  HEENT PERRLA, extraocular movement intact and sclera clear, anicteric  Neck supple and no masses  Cardiovascular: regular rate and rhythm  Respiratory:  no respiratory distress, normal breath sounds  Abdomen: soft, non-tender; bowel sounds normal; no masses,  no  organomegaly  Pelvic: Pt self swabbed, declined pelvic    Assessment:    Pregnancy: [id number] Patient Active Problem List   Diagnosis Date Noted  . Supervision of low-risk pregnancy 12/20/2020  . Headache in pregnancy, antepartum, third trimester 08/19/2019  . History of pre-eclampsia in prior pregnancy, currently pregnant 06/24/2019  . Patient's reproductive partner has sickle cell anemia (Hgb SS) 06/24/2019    Plan:    1. Encounter for supervision of low-risk pregnancy in first trimester - Transferred to [facility] at 30 weeks with previous pregnancy.  - Welcomed back to practice, happy she is here! - LOB, reviewed scheduled of visits - Hx term SVD x 2 - Desires waterbirth. Reviewed ongoing discussion of risk factors and eligibility as pregnancy progresses.  - Reviewed goal of CNMS for visits as much as possible.  - Advised consideration of doula, given list, discussed doula approval process for [facility].  - Culture, OB Urine - Genetic Screening - CBC/D/Plt+RPR+Rh+ABO+Rub Ab... - Cervicovaginal ancillary only( [place]) - CHL AMB BABYSCRIPTS SCHEDULE OPTIMIZATION - [place] MFM OB COMP + 14 WK; Future  2. Hx of preeclampsia, prior pregnancy, currently pregnant - Discussed HTN as contraindication for waterbirth - Discussed data for bASA 26m - Comp Met (CMET) - Protein / creatinine ratio, urine - aspirin EC 81 MG tablet; Take 1 tablet (81 mg total) by mouth daily. Take after 12 weeks for prevention of preeclampsia later in pregnancy (after 12/26/2020).  Dispense: 300 tablet; Refill: 2  3. Headache in pregnancy, antepartum, third trimester - Chronic, not present today - Patient to consider virtual appointment with KAllie DimmerPRN  Initial labs drawn. Continue prenatal vitamins. Problem list reviewed and updated. Genetic Screening discussed, First trimester screen, Quad screen and NIPS: ordered. Ultrasound discussed; fetal anatomic survey: ordered. Anticipatory guidance about  prenatal visits given including labs, ultrasounds, and testing. Discussed usage of Babyscripts and virtual visits as additional source of managing and completing prenatal visits in midst of coronavirus and pandemic.   Encouraged to complete MyChart Registration for her ability to review results, send requests, and have questions addressed.  The nature [facility] with multiple MDs and Advanced Practice Providers was explained to patient; also emphasized that residents, students are part of our team. Routine obstetric precautions reviewed. Encouraged to seek out care at office or emergency room [facility]) for urgent and/or emergent concerns. Return in about 4 weeks (around 01/17/2021).    SMallie Snooks MSN, CNM Certified Nurse Midwife, [facility]ospital [facility] Surgeryfor WDean Foods Company CMcIntosh01/04/22 11:50 AM

## 2020-12-21 LAB — COMPREHENSIVE METABOLIC PANEL
ALT: 12 IU/L (ref 0–32)
AST: 9 IU/L (ref 0–40)
Albumin/Globulin Ratio: 1.7 (ref 1.2–2.2)
Albumin: 4.3 g/dL (ref 3.9–5.0)
Alkaline Phosphatase: 48 IU/L (ref 44–121)
BUN/Creatinine Ratio: 10 (ref 9–23)
BUN: 6 mg/dL (ref 6–20)
Bilirubin Total: 0.3 mg/dL (ref 0.0–1.2)
CO2: 22 mmol/L (ref 20–29)
Calcium: 9.5 mg/dL (ref 8.7–10.2)
Chloride: 103 mmol/L (ref 96–106)
Creatinine, Ser: 0.59 mg/dL (ref 0.57–1.00)
GFR calc Af Amer: 148 mL/min/{1.73_m2} (ref >59)
GFR calc non Af Amer: 129 mL/min/{1.73_m2} (ref >59)
Globulin, Total: 2.5 g/dL (ref 1.5–4.5)
Glucose: 82 mg/dL (ref 65–99)
Potassium: 4.3 mmol/L (ref 3.5–5.2)
Sodium: 138 mmol/L (ref 134–144)
Total Protein: 6.8 g/dL (ref 6.0–8.5)

## 2020-12-21 LAB — CERVICOVAGINAL ANCILLARY ONLY
Chlamydia: NEGATIVE
Comment: NEGATIVE
Comment: NEGATIVE
Comment: NORMAL
Neisseria Gonorrhea: NEGATIVE
Trichomonas: NEGATIVE

## 2020-12-21 LAB — CBC/D/PLT+RPR+RH+ABO+RUB AB...
Antibody Screen: NEGATIVE
Basophils Absolute: 0 10*3/uL (ref 0.0–0.2)
Basos: 1 %
EOS (ABSOLUTE): 0.1 10*3/uL (ref 0.0–0.4)
Eos: 2 %
HCV Ab: <0.1 s/co ratio (ref 0.0–0.9)
HIV Screen 4th Generation wRfx: NONREACTIVE
Hematocrit: 39.9 % (ref 34.0–46.6)
Hemoglobin: 13.1 g/dL (ref 11.1–15.9)
Hepatitis B Surface Ag: NEGATIVE
Immature Grans (Abs): 0 10*3/uL (ref 0.0–0.1)
Immature Granulocytes: 0 %
Lymphocytes Absolute: 1.4 10*3/uL (ref 0.7–3.1)
Lymphs: 32 %
MCH: 27.9 pg (ref 26.6–33.0)
MCHC: 32.8 g/dL (ref 31.5–35.7)
MCV: 85 fL (ref 79–97)
Monocytes Absolute: 0.3 10*3/uL (ref 0.1–0.9)
Monocytes: 6 %
Neutrophils Absolute: 2.6 10*3/uL (ref 1.4–7.0)
Neutrophils: 59 %
Platelets: 277 10*3/uL (ref 150–450)
RBC: 4.69 x10E6/uL (ref 3.77–5.28)
RDW: 12.6 % (ref 11.7–15.4)
RPR Ser Ql: NONREACTIVE
Rh Factor: POSITIVE
Rubella Antibodies, IGG: 3.92 index (ref >0.99)
WBC: 4.4 10*3/uL (ref 3.4–10.8)

## 2020-12-21 LAB — HCV INTERPRETATION

## 2020-12-21 LAB — PROTEIN / CREATININE RATIO, URINE
Creatinine, Urine: 178.3 mg/dL
Protein, Ur: 11.3 mg/dL
Protein/Creat Ratio: 63 mg/g creat (ref 0–200)

## 2020-12-22 LAB — URINE CULTURE, OB REFLEX

## 2020-12-22 LAB — CULTURE, OB URINE

## 2020-12-26 ENCOUNTER — Other Ambulatory Visit: Payer: Self-pay | Admitting: Advanced Practice Midwife

## 2021-01-17 ENCOUNTER — Telehealth: Payer: Medicaid Other | Admitting: Physician Assistant

## 2021-01-17 ENCOUNTER — Encounter: Payer: Self-pay | Admitting: Physician Assistant

## 2021-01-17 DIAGNOSIS — O219 Vomiting of pregnancy, unspecified: Secondary | ICD-10-CM

## 2021-01-17 MED ORDER — DOXYLAMINE-PYRIDOXINE 10-10 MG PO TBEC: 1.0000 | DELAYED_RELEASE_TABLET | Freq: Two times a day (BID) | ORAL | 0 refills | Status: DC

## 2021-01-17 NOTE — Progress Notes (Signed)
   Virtual Visit via Video   I connected with patient on 01/17/21 at  1:45 PM EST by a video enabled telemedicine application and verified that I am speaking with the correct person using two identifiers.  Location patient: Home Location provider: [place], Office Persons participating in the virtual visit: Patient, Provider, CMA (Patina Moore)  I discussed the limitations of evaluation and management by telemedicine and the availability of in person appointments. The patient expressed understanding and agreed to proceed.  Subjective:   HPI:   Patient who is ~ 3.5 months pregnant presents via Careg[facility] today c/o few episodes of non-bloody emesis, starting this morning. Notes last night when she went to be she felt just fine. Notes associated nausea but denies abdominal pain, diarrhea, heartburn, fever, chills or sweats. Is trying to keep hydrated but can only drink sips at a time. No matter what she tries to eat, she has thrown it up so she has not tried to eat anything since this morning. Denies melena, hematochezia or tenesmus. Notes normal urinary habits with lightly colored urine. She denies recent travel or sick contact. Denies abnormal food or water source.   Has not reached out to her OB-GYN for input yet. She is listed as a low-risk pregnancy per recent prenatal visit.   ROS:   See pertinent positives and negatives per HPI.  Patient Active Problem List   Diagnosis Date Noted  . Supervision of low-risk pregnancy 12/20/2020  . Headache in pregnancy, antepartum, third trimester 08/19/2019  . History of pre-eclampsia in prior pregnancy, currently pregnant 06/24/2019  . Patient's reproductive partner has sickle cell anemia (Hgb SS) 06/24/2019    Social History   Tobacco Use  . Smoking status: Current Every Day Smoker    Packs/day: 0.25    Types: Cigarettes  . Smokeless tobacco: Never Used  Substance Use Topics  . Alcohol use: No    Current Outpatient  Medications:  .  aspirin EC 81 MG tablet, Take 1 tablet (81 mg total) by mouth daily. Take after 12 weeks for prevention of preeclampsia later in pregnancy (after 12/26/2020)., Disp: 300 tablet, Rfl: 2 .  prenatal vitamin w/FE, FA (NATACHEW) 29-1 MG CHEW chewable tablet, Chew 1 tablet by mouth daily at 12 noon., Disp: 30 tablet, Rfl: 11  No Known Allergies  Objective:   There were no vitals taken for this visit.  Patient is well-developed, well-nourished in no acute distress.  Resting comfortably at home.  Head is normocephalic, atraumatic.  No labored breathing.  Speech is clear and coherent with logical content.  Patient is alert and oriented at baseline.   Assessment and Plan:   1. Nausea and vomiting in pregnancy Starting this morning. Few episodes of nausea and emesis without other symptoms. Is tolerating small amounts of PO fluids at present.  Patient to continue to push PO fluids, sips at a time are ok. Keep to liquids for now, introducing a brat diet as soon as we can get some of the nausea under control. Reviewed up-to-date for treatment of nausea/emesis. Doxylamine-pyridoxine is preferred intial agent. Will start her taking 1 tab BID as directed if needed. She is to contact her OB for further management. Strict ER/MAU precautions discussed with patient. She voiced understanding and agreement with the plan.     [name], [place] 01/17/2021

## 2021-01-17 NOTE — Progress Notes (Signed)
Ms. Wanda Porter, Wanda Porter are scheduled for a virtual visit with your provider today.    Just as we do with appointments in the office, we must obtain your consent to participate.  Your consent will be active for this visit and any virtual visit you may have with one of our providers in the next 365 days.    If you have a MyChart account, I can also send a copy of this consent to you electronically.  All virtual visits are billed to your insurance company just like a traditional visit in the office.  As this is a virtual visit, video technology does not allow for your provider to perform a traditional examination.  This may limit your provider's ability to fully assess your condition.  If your provider identifies any concerns that need to be evaluated in person or the need to arrange testing such as labs, EKG, etc, we will make arrangements to do so.    Although advances in technology are sophisticated, we cannot ensure that it will always work on either your end or our end.  If the connection with a video visit is poor, we may have to switch to a telephone visit.  With either a video or telephone visit, we are not always able to ensure that we have a secure connection.   I need to obtain your verbal consent now.   Are you willing to proceed with your visit today?   [name] has provided verbal consent on 01/17/2021 for a virtual visit (video or telephone).   [name], PA-C 01/17/2021  1:42 PM

## 2021-01-17 NOTE — Patient Instructions (Signed)
Instructions sent to patients MyChart.

## 2021-01-18 ENCOUNTER — Ambulatory Visit (INDEPENDENT_AMBULATORY_CARE_PROVIDER_SITE_OTHER): Payer: Medicaid Other | Admitting: Obstetrics and Gynecology

## 2021-01-18 ENCOUNTER — Encounter: Payer: Self-pay | Admitting: Obstetrics and Gynecology

## 2021-01-18 ENCOUNTER — Other Ambulatory Visit: Payer: Self-pay

## 2021-01-18 VITALS — BP 127/83 | HR 109 | Wt 120.0 lb

## 2021-01-18 DIAGNOSIS — Z3A15 15 weeks gestation of pregnancy: Secondary | ICD-10-CM

## 2021-01-18 DIAGNOSIS — O09299 Supervision of pregnancy with other poor reproductive or obstetric history, unspecified trimester: Secondary | ICD-10-CM | POA: Diagnosis not present

## 2021-01-18 DIAGNOSIS — Z3491 Encounter for supervision of normal pregnancy, unspecified, first trimester: Secondary | ICD-10-CM | POA: Diagnosis not present

## 2021-01-18 NOTE — Progress Notes (Signed)
   PRENATAL VISIT NOTE  Subjective:  Wanda Porter is a 25 y.o. [phone] at [redacted]w[redacted]d being seen today for ongoing prenatal care.  She is currently monitored for the following issues for this low-risk pregnancy and has History of pre-eclampsia in prior pregnancy, currently pregnant; Patient's reproductive partner has sickle cell anemia (Hgb SS); Headache in pregnancy, antepartum, third trimester; and Supervision of low-risk pregnancy on their problem list.  Patient reports fatigue. Contractions: Not present. Vag. Bleeding: None.  Movement: Absent. Denies leaking of fluid.   Very nausesous and sick yesterday. Worse than any prior pregnancy nausea she has had before. Had virtual visit with provider who prescribed her diclegis. She threw this up as well. However she improved over the day and feels better today. Tolearting PO today but feels very fatigued. No energy. Nausea has improved. Had soup this AM.  No diarrhea. Thinks pregnancy related, no one else is sick. Hasn't eaten out.   The following portions of the patient's history were reviewed and updated as appropriate: allergies, current medications, past family history, past medical history, past social history, past surgical history and problem list.   Objective:   Vitals:   01/18/21 1010  BP: 127/83  Pulse: (!) 109  Weight: 120 lb (54.4 kg)    Fetal Status: Fetal Heart Rate (bpm): 154   Movement: Absent     General:  Alert, oriented and cooperative. Patient is in no acute distress.  Skin: Skin is warm and dry. No rash noted.   Cardiovascular: Normal heart rate noted  Respiratory: Normal respiratory effort, no problems with respiration noted  Abdomen: Soft, gravid, appropriate for gestational age.  Pain/Pressure: Absent     Pelvic: Cervical exam deferred        Extremities: Normal range of motion.  Edema: None  Mental Status: Normal mood and affect. Normal behavior. Normal judgment and thought content.   Assessment and Plan:  Pregnancy:  [id number] at [redacted]w[redacted]d 1. Encounter for supervision of low-risk pregnancy in first trimester -nausea, now improved. Can use diclegis prn, not interested in any other anti-nausea meds at this time . -otherwise doing well   2. Hx of preeclampsia, prior pregnancy, currently pregnant -cont asa   3. [redacted] weeks gestation of pregnancy -anat scan already scheduled   Preterm labor symptoms and general obstetric precautions including but not limited to vaginal bleeding, contractions, leaking of fluid and fetal movement were reviewed in detail with the patient. Please refer to After Visit Summary for other counseling recommendations.   Return in about 4 weeks (around 02/15/2021) for OB, midwife preferred .  Future Appointments  Date Time Provider Department Center  02/13/2021  9:45 AM [facility] US[facility]    Gita Kudo, MD

## 2021-01-19 ENCOUNTER — Inpatient Hospital Stay (HOSPITAL_COMMUNITY)
Admission: EM | Admit: 2021-01-19 | Discharge: 2021-01-19 | Disposition: A | Discharge status: Home / Self Care | Payer: Medicaid Other | Attending: Family Medicine | Admitting: Family Medicine

## 2021-01-19 ENCOUNTER — Encounter (HOSPITAL_COMMUNITY): Payer: Self-pay | Admitting: Obstetrics and Gynecology

## 2021-01-19 ENCOUNTER — Other Ambulatory Visit: Payer: Self-pay

## 2021-01-19 DIAGNOSIS — O99891 Other specified diseases and conditions complicating pregnancy: Secondary | ICD-10-CM | POA: Diagnosis not present

## 2021-01-19 DIAGNOSIS — R197 Diarrhea, unspecified: Secondary | ICD-10-CM | POA: Insufficient documentation

## 2021-01-19 DIAGNOSIS — O98812 Other maternal infectious and parasitic diseases complicating pregnancy, second trimester: Secondary | ICD-10-CM | POA: Diagnosis not present

## 2021-01-19 DIAGNOSIS — O219 Vomiting of pregnancy, unspecified: Secondary | ICD-10-CM | POA: Insufficient documentation

## 2021-01-19 DIAGNOSIS — R110 Nausea: Secondary | ICD-10-CM

## 2021-01-19 DIAGNOSIS — A084 Viral intestinal infection, unspecified: Secondary | ICD-10-CM | POA: Diagnosis not present

## 2021-01-19 DIAGNOSIS — Z20822 Contact with and (suspected) exposure to covid-19: Secondary | ICD-10-CM | POA: Insufficient documentation

## 2021-01-19 DIAGNOSIS — O26891 Other specified pregnancy related conditions, first trimester: Secondary | ICD-10-CM | POA: Diagnosis not present

## 2021-01-19 DIAGNOSIS — Z3491 Encounter for supervision of normal pregnancy, unspecified, first trimester: Secondary | ICD-10-CM

## 2021-01-19 DIAGNOSIS — Z3A15 15 weeks gestation of pregnancy: Secondary | ICD-10-CM | POA: Diagnosis not present

## 2021-01-19 LAB — URINALYSIS, ROUTINE W REFLEX MICROSCOPIC
Bilirubin Urine: NEGATIVE
Glucose, UA: NEGATIVE mg/dL
Hgb urine dipstick: NEGATIVE
Ketones, ur: 80 mg/dL — AB
Leukocytes,Ua: NEGATIVE
Nitrite: NEGATIVE
Protein, ur: 30 mg/dL — AB
Specific Gravity, Urine: 1.029 (ref 1.005–1.030)
pH: 5 (ref 5.0–8.0)

## 2021-01-19 LAB — COMPREHENSIVE METABOLIC PANEL
ALT: 21 U/L (ref 0–44)
AST: 25 U/L (ref 15–41)
Albumin: 3.3 g/dL — ABNORMAL LOW (ref 3.5–5.0)
Alkaline Phosphatase: 43 U/L (ref 38–126)
Anion gap: 13 (ref 5–15)
BUN: 5 mg/dL — ABNORMAL LOW (ref 6–20)
CO2: 21 mmol/L — ABNORMAL LOW (ref 22–32)
Calcium: 9.5 mg/dL (ref 8.9–10.3)
Chloride: 98 mmol/L (ref 98–111)
Creatinine, Ser: 0.61 mg/dL (ref 0.44–1.00)
GFR, Estimated: >60 mL/min (ref >60)
Glucose, Bld: 94 mg/dL (ref 70–99)
Potassium: 3.7 mmol/L (ref 3.5–5.1)
Sodium: 132 mmol/L — ABNORMAL LOW (ref 135–145)
Total Bilirubin: 0.4 mg/dL (ref 0.3–1.2)
Total Protein: 7.2 g/dL (ref 6.5–8.1)

## 2021-01-19 LAB — SARS CORONAVIRUS 2 (TAT 6-24 HRS): SARS Coronavirus 2: NEGATIVE

## 2021-01-19 MED ORDER — ONDANSETRON HCL 4 MG/2ML IJ SOLN
4.0000 mg | Freq: Once | INTRAMUSCULAR | Status: AC
  Administered 2021-01-19: 4 mg via INTRAVENOUS
  Filled 2021-01-19: qty 2

## 2021-01-19 MED ORDER — SCOPOLAMINE 1 MG/3DAYS TD PT72
1.0000 | MEDICATED_PATCH | TRANSDERMAL | Status: DC
  Filled 2021-01-19: qty 1

## 2021-01-19 MED ORDER — PROMETHAZINE HCL 25 MG/ML IJ SOLN
12.5000 mg | Freq: Four times a day (QID) | INTRAMUSCULAR | Status: DC | PRN
  Filled 2021-01-19: qty 1

## 2021-01-19 MED ORDER — LACTATED RINGERS IV BOLUS
1000.0000 mL | Freq: Once | INTRAVENOUS | Status: AC
  Administered 2021-01-19: 1000 mL via INTRAVENOUS

## 2021-01-19 MED ORDER — ONDANSETRON 4 MG PO TBDP: 4.0000 mg | ORAL_TABLET | Freq: Three times a day (TID) | ORAL | 0 refills | Status: DC | PRN

## 2021-01-19 NOTE — MAU Note (Signed)
Pt transfered from [facility] with c/o n/v/d.  Has had N/V but diarrhea started today (x5).  Denies any pain or cramping. Taking nausea medication but not helping.

## 2021-01-19 NOTE — ED Triage Notes (Signed)
Pt arrives to ED reporting [redacted] weeks pregnant and for 3-4 days has not been ablwe to keep food down due to N/v and today had diarheaa. [facility]. Denies any pain or vaginal bleeding.

## 2021-01-19 NOTE — ED Triage Notes (Signed)
Emergency Medicine Provider OB Triage Evaluation Note  Wanda Porter is a 25 y.o. female, [id number], at [redacted]w[redacted]d gestation who presents to the emergency department with complaints of n/v.  Patient states of the past 2 days, she has had persistent nausea and vomiting.  Vomiting 5-6 times a day.  No blood in her emesis.  She saw her OB/GYN who prescribed her medication, she does not know the name.  However she continues to vomit.  She denies fevers, chills, cough, chest pain, shortness of breath, urinary symptoms, vaginal discharge, or vaginal bleeding.  This is her third pregnancy, she had preeclampsia with her first, but no concerns about her blood pressure in this pregnancy.  She follows with: OB/GYN.  She has had confirmation of IUP with ultrasound.  Review of  Systems  Positive: n/v Negative: abd pain, fever, vaginal bleeding  Physical Exam  BP 124/76 (BP Location: Right Arm)   Pulse (!) 118   Temp 98.4 F (36.9 C) (Oral)   Resp 16   SpO2 99%  General: Awake, no distress  HEENT: Atraumatic  Resp: Normal effort  Cardiac: tachycardic Abd: Nondistended, nontender  MSK: Moves all extremities without difficulty Neuro: Speech clear  Medical Decision Making  Pt evaluated for pregnancy concern and is stable for transfer to MAU. Pt is in agreement with plan for transfer.  11:42 AM Discussed with MAU APP, Joni Reining, who accepts patient in transfer.  Clinical Impression  No diagnosis found.     Alveria Apley, PA-C 01/19/21 1144

## 2021-01-19 NOTE — MAU Provider Note (Signed)
History     [id number]   Arrival date and time: 01/19/21 1114    Chief Complaint  Patient presents with  . Nausea     HPI Wanda Porter is a 25 y.o. at [redacted]w[redacted]d by 1st trimester [place] with PMHx notable for hx of PreE, who presents for nausea, vomiting, and diarrhea.   Review of outside prenatal records from [facility] Office: seen yesterday in clinic, reported bad n/v, rx diclegis  Reports three day history of nausea and vomiting as well as diarrhea that started this morning. No blood in emesis or diarrhea. Denies congestion, cough, shortness of breath, chest pain, abdominal pain, vaginal bleeding, loss of fluid, contractions. Endorses good fetal movement. No abnormal foods eaten recently. No COVID contacts. No one else at home is sick. Nothing makes it better, eating food makes her symptoms worse. Tried diclegis but it was not effective.   O/Positive/-- (01/04 1048)  OB History    Gravida  4   Para  2   Term  2   Preterm  0   AB  1   Living  2     SAB  0   IAB  1   Ectopic  0   Multiple  0   Live Births  2           Past Medical History:  Diagnosis Date  . Irregular fetal heart rate 07/04/2019  . Pre-eclampsia   . Single spontaneous delivery 08/25/2019  . Supervision of low-risk pregnancy 06/25/2019    Nursing Staff Provider Office Location [facility] [place]  Dating   LMP consistent with early [place] Language  English Anatomy US   Normal Flu Vaccine   Genetic Screen  NIPS:   AFP:   First Screen:  Quad:   TDaP vaccine    Hgb A1C or  GTT Early  Third trimester  Rhogam   N/A   LAB RESULTS  Feeding Plan  Breast/Bottle Blood Type --/Positive/-- (03/08 0000)  Contraception  Depo Antibody Negative (02/06 0000) Cir    Past Surgical History:  Procedure Laterality Date  . NO PAST SURGERIES      Family History  Problem Relation Age of Onset  . Diabetes Other   . Hypertension Other   . Diabetes Father   . Hypertension Maternal Grandmother     Social History    Socioeconomic History  . Marital status: Single    Spouse name: Not on file  . Number of children: Not on file  . Years of education: Not on file  . Highest education level: Not on file  Occupational History  . Not on file  Tobacco Use  . Smoking status: Former Smoker    Packs/day: 0.25    Types: Cigarettes  . Smokeless tobacco: Never Used  Vaping Use  . Vaping Use: Never used  Substance and Sexual Activity  . Alcohol use: No  . Drug use: No  . Sexual activity: Yes    Birth control/protection: None  Other Topics Concern  . Not on file  Social History Narrative  . Not on file   Social Determinants of Health   Financial Resource Strain: Not on file  Food Insecurity: No Food Insecurity  . Worried About Programme researcher, broadcasting/film/video in the Last Year: Never true  . Ran Out of Food in the Last Year: Never true  Transportation Needs: No Transportation Needs  . Lack of Transportation (Medical): No  . Lack of Transportation (Non-Medical): No  Physical Activity: Not  on file  Stress: Not on file  Social Connections: Not on file  Intimate Partner Violence: Not on file    No Known Allergies  No current facility-administered medications on file prior to encounter.   Current Outpatient Medications on File Prior to Encounter  Medication Sig Dispense Refill  . Doxylamine-Pyridoxine 10-10 MG TBEC Take 1 tablet by mouth in the morning and at bedtime. 10 tablet 0  . prenatal vitamin w/FE, FA (NATACHEW) 29-1 MG CHEW chewable tablet Chew 1 tablet by mouth daily at 12 noon. 30 tablet 11  . aspirin EC 81 MG tablet Take 1 tablet (81 mg total) by mouth daily. Take after 12 weeks for prevention of preeclampsia later in pregnancy (after 12/26/2020). (Patient not taking: Reported on 01/18/2021) 300 tablet 2     ROS Pertinent positives and negative per HPI, all others reviewed and negative  Physical Exam   BP 127/81   Pulse (!) 109   Temp 98.4 F (36.9 C) (Oral)   Resp 18   Ht 5' (1.524 m)    Wt 53.5 kg   SpO2 99%   BMI 23.05 kg/m   Patient Vitals for the past 24 hrs:  BP Temp Temp src Pulse Resp SpO2 Height Weight  01/19/21 1242 127/81 -- -- (!) 109 18 -- 5' (1.524 m) 53.5 kg  01/19/21 1133 124/76 98.4 F (36.9 C) Oral (!) 118 16 99 % -- --    Physical Exam Vitals reviewed.  Constitutional:      General: She is not in acute distress.    Appearance: She is well-developed and well-nourished. She is not diaphoretic.  HENT:     Mouth/Throat:     Mouth: Mucous membranes are dry.  Eyes:     General: No scleral icterus. Pulmonary:     Effort: Pulmonary effort is normal. No respiratory distress.  Abdominal:     General: There is no distension.     Palpations: Abdomen is soft.     Tenderness: There is no abdominal tenderness. There is no guarding or rebound.  Musculoskeletal:        General: No edema.  Skin:    General: Skin is warm and dry.  Neurological:     Mental Status: She is alert.     Coordination: Coordination normal.  Psychiatric:        Mood and Affect: Mood and affect normal.      Cervical Exam    Bedside Ultrasound Not done  My interpretation: n/a  FHT FHR 165 bpm by doppler  Labs Results for orders placed or performed during the hospital encounter of 01/19/21 (from the past 24 hour(s))  Comprehensive metabolic panel     Status: Abnormal   Collection Time: 01/19/21  1:23 PM  Result Value Ref Range   Sodium 132 (L) 135 - 145 mmol/L   Potassium 3.7 3.5 - 5.1 mmol/L   Chloride 98 98 - 111 mmol/L   CO2 21 (L) 22 - 32 mmol/L   Glucose, Bld 94 70 - 99 mg/dL   BUN 5 (L) 6 - 20 mg/dL   Creatinine, Ser 8.28 0.44 - 1.00 mg/dL   Calcium 9.5 8.9 - 00.3 mg/dL   Total Protein 7.2 6.5 - 8.1 g/dL   Albumin 3.3 (L) 3.5 - 5.0 g/dL   AST 25 15 - 41 U/L   ALT 21 0 - 44 U/L   Alkaline Phosphatase 43 38 - 126 U/L   Total Bilirubin 0.4 0.3 - 1.2 mg/dL   GFR,  Estimated >60 >60 mL/min   Anion gap 13 5 - 15  Urinalysis, Routine w reflex microscopic  Urine, Clean Catch     Status: Abnormal   Collection Time: 01/19/21  1:46 PM  Result Value Ref Range   Color, Urine YELLOW YELLOW   APPearance HAZY (A) CLEAR   Specific Gravity, Urine 1.029 1.005 - 1.030   pH 5.0 5.0 - 8.0   Glucose, UA NEGATIVE NEGATIVE mg/dL   Hgb urine dipstick NEGATIVE NEGATIVE   Bilirubin Urine NEGATIVE NEGATIVE   Ketones, ur 80 (A) NEGATIVE mg/dL   Protein, ur 30 (A) NEGATIVE mg/dL   Nitrite NEGATIVE NEGATIVE   Leukocytes,Ua NEGATIVE NEGATIVE   RBC / HPF 0-5 0 - 5 RBC/hpf   WBC, UA 0-5 0 - 5 WBC/hpf   Bacteria, UA RARE (A) NONE SEEN   Squamous Epithelial / LPF 0-5 0 - 5   Mucus PRESENT     Imaging No results found.  MAU Course  Procedures  Lab Orders     SARS CORONAVIRUS 2 (TAT 6-24 HRS) Nasopharyngeal Nasopharyngeal Swab     Urinalysis, Routine w reflex microscopic Urine, Clean Catch     Comprehensive metabolic panel Meds ordered this encounter  Medications  . ondansetron (ZOFRAN) injection 4 mg  . promethazine (PHENERGAN) injection 12.5 mg  . DISCONTD: scopolamine (TRANSDERM-SCOP) 1 MG/3DAYS 1.5 mg  . lactated ringers bolus 1,000 mL  . ondansetron (ZOFRAN ODT) 4 MG disintegrating tablet    Sig: Take 1 tablet (4 mg total) by mouth every 8 (eight) hours as needed for nausea or vomiting.    Dispense:  20 tablet    Refill:  0   Imaging Orders  No imaging studies ordered today    MDM moderate  Assessment and Plan  #Nausea and vomiting #Diarrhea No prior n/v in this pregnancy, suspect viral illness. Less likely food poisoning given prolonged symptoms, more likely viral gastro. Less likely COVID but will swab regardless. Check CMP to ensure no metabolic derangements. Zofran for nausea, give fluids given appears dehydrated on exam.  1:57 PM  Patient feeling better, able to tolerate PO challenge. CMP without metabolic derangements. Discharged to home in stable condition. 3:28 PM   #FWB Normal FHR by doppler  Venora Maples,  MD/MPH 01/19/21 3:28 PM  Allergies as of 01/19/2021   No Known Allergies     Medication List    TAKE these medications   aspirin EC 81 MG tablet Take 1 tablet (81 mg total) by mouth daily. Take after 12 weeks for prevention of preeclampsia later in pregnancy (after 12/26/2020).   Doxylamine-Pyridoxine 10-10 MG Tbec Take 1 tablet by mouth in the morning and at bedtime.   ondansetron 4 MG disintegrating tablet Commonly known as: Zofran ODT Take 1 tablet (4 mg total) by mouth every 8 (eight) hours as needed for nausea or vomiting.   prenatal vitamin w/FE, FA 29-1 MG Chew chewable tablet Chew 1 tablet by mouth daily at 12 noon.

## 2021-01-24 ENCOUNTER — Encounter: Payer: Medicaid Other | Admitting: Student

## 2021-02-10 ENCOUNTER — Other Ambulatory Visit: Payer: Self-pay | Admitting: *Deleted

## 2021-02-10 ENCOUNTER — Ambulatory Visit: Payer: Medicaid Other | Attending: Advanced Practice Midwife

## 2021-02-10 ENCOUNTER — Other Ambulatory Visit: Payer: Self-pay

## 2021-02-10 DIAGNOSIS — Z3491 Encounter for supervision of normal pregnancy, unspecified, first trimester: Secondary | ICD-10-CM | POA: Diagnosis not present

## 2021-02-10 DIAGNOSIS — Z362 Encounter for other antenatal screening follow-up: Secondary | ICD-10-CM

## 2021-02-13 ENCOUNTER — Ambulatory Visit: Payer: Medicaid Other

## 2021-02-15 ENCOUNTER — Encounter: Payer: Medicaid Other | Admitting: Nurse Practitioner

## 2021-03-06 ENCOUNTER — Encounter: Payer: Self-pay | Admitting: *Deleted

## 2021-03-09 ENCOUNTER — Telehealth: Payer: Medicaid Other | Admitting: Physician Assistant

## 2021-03-09 NOTE — Progress Notes (Signed)
No show for appointment.  Wanda Porter 03/09/21 11:57 AM

## 2021-03-14 ENCOUNTER — Encounter: Payer: Self-pay | Admitting: *Deleted

## 2021-03-14 ENCOUNTER — Ambulatory Visit: Payer: Medicaid Other | Admitting: *Deleted

## 2021-03-14 ENCOUNTER — Ambulatory Visit: Payer: Medicaid Other | Attending: Maternal & Fetal Medicine

## 2021-03-14 ENCOUNTER — Other Ambulatory Visit: Payer: Self-pay | Admitting: *Deleted

## 2021-03-14 ENCOUNTER — Other Ambulatory Visit: Payer: Self-pay

## 2021-03-14 VITALS — BP 122/61 | HR 89

## 2021-03-14 DIAGNOSIS — O09299 Supervision of pregnancy with other poor reproductive or obstetric history, unspecified trimester: Secondary | ICD-10-CM | POA: Diagnosis present

## 2021-03-14 DIAGNOSIS — O3663X0 Maternal care for excessive fetal growth, third trimester, not applicable or unspecified: Secondary | ICD-10-CM

## 2021-03-14 DIAGNOSIS — Z362 Encounter for other antenatal screening follow-up: Secondary | ICD-10-CM | POA: Insufficient documentation

## 2021-04-10 ENCOUNTER — Encounter: Payer: Self-pay | Admitting: Lactation Services

## 2021-05-09 ENCOUNTER — Ambulatory Visit: Payer: Medicaid Other

## 2021-05-11 ENCOUNTER — Ambulatory Visit (HOSPITAL_BASED_OUTPATIENT_CLINIC_OR_DEPARTMENT_OTHER): Payer: Medicaid Other

## 2021-05-11 ENCOUNTER — Encounter: Payer: Self-pay | Admitting: *Deleted

## 2021-05-11 ENCOUNTER — Other Ambulatory Visit: Payer: Self-pay

## 2021-05-11 ENCOUNTER — Ambulatory Visit: Payer: Medicaid Other | Attending: Obstetrics and Gynecology | Admitting: *Deleted

## 2021-05-11 VITALS — BP 127/66 | HR 91

## 2021-05-11 DIAGNOSIS — O09293 Supervision of pregnancy with other poor reproductive or obstetric history, third trimester: Secondary | ICD-10-CM | POA: Diagnosis not present

## 2021-05-11 DIAGNOSIS — O3663X0 Maternal care for excessive fetal growth, third trimester, not applicable or unspecified: Secondary | ICD-10-CM

## 2021-05-11 DIAGNOSIS — Z3A31 31 weeks gestation of pregnancy: Secondary | ICD-10-CM | POA: Diagnosis not present

## 2021-05-11 DIAGNOSIS — Z362 Encounter for other antenatal screening follow-up: Secondary | ICD-10-CM | POA: Diagnosis not present

## 2021-05-11 DIAGNOSIS — Z3689 Encounter for other specified antenatal screening: Secondary | ICD-10-CM

## 2021-05-11 DIAGNOSIS — Z8759 Personal history of other complications of pregnancy, childbirth and the puerperium: Secondary | ICD-10-CM | POA: Diagnosis not present

## 2021-05-16 ENCOUNTER — Ambulatory Visit: Payer: Medicaid Other

## 2021-05-29 ENCOUNTER — Encounter: Payer: Self-pay | Admitting: *Deleted

## 2021-05-29 ENCOUNTER — Telehealth: Payer: Self-pay | Admitting: *Deleted

## 2021-05-29 NOTE — Telephone Encounter (Signed)
I called Acire to discuss her complaints from her MyChart message. I left a message I was calling to discuss her complaints and if she is still having severe symptoms I do recommend she go to [facility]U for evaluation . I also sent mychart message Markala Sitts,RN

## 2021-05-29 NOTE — Telephone Encounter (Signed)
Called pt in response to her Mychart message. She had expressed concerns for possible pre-eclampsia given frequent headaches and significant swelling of feet and ankles. She does not have a headache at the time of our conversation. She had stated in her Mychart message that her BP is normal. Pt was offered appt today @ 2:30 w/Dr. Alvester Morin in lieu of appointment on 6/16. Pt was advised importance of timely evaluation. She stated that she cannot leave work. After learning that Tylenol does not help her headaches and that she is very miserable from the excessive swelling of her feet, I advised pt to go to MAU for evaluation once she is out of work. She then stated that she does not have childcare. We discussed the extreme importance of receiving prompt medical care for her symptoms and due to her history of pre-eclampsia in a previous pregnancy. Pt voiced understanding and stated that she will seek care @ MAU if her headache returns and/or her BP becomes elevated.

## 2021-05-30 ENCOUNTER — Telehealth: Payer: Medicaid Other | Admitting: Physician Assistant

## 2021-05-30 ENCOUNTER — Encounter: Payer: Self-pay | Admitting: Physician Assistant

## 2021-05-30 DIAGNOSIS — R609 Edema, unspecified: Secondary | ICD-10-CM

## 2021-05-30 DIAGNOSIS — Z3A34 34 weeks gestation of pregnancy: Secondary | ICD-10-CM

## 2021-05-30 DIAGNOSIS — O09299 Supervision of pregnancy with other poor reproductive or obstetric history, unspecified trimester: Secondary | ICD-10-CM

## 2021-05-30 NOTE — Progress Notes (Signed)
[name], deckman are scheduled for a virtual visit with your provider today.    Just as we do with appointments in the office, we must obtain your consent to participate.  Your consent will be active for this visit and any virtual visit you may have with one of our providers in the next 365 days.    If you have a MyChart account, I can also send a copy of this consent to you electronically.  All virtual visits are billed to your insurance company just like a traditional visit in the office.  As this is a virtual visit, video technology does not allow for your provider to perform a traditional examination.  This may limit your provider's ability to fully assess your condition.  If your provider identifies any concerns that need to be evaluated in person or the need to arrange testing such as labs, EKG, etc, we will make arrangements to do so.    Although advances in technology are sophisticated, we cannot ensure that it will always work on either your end or our end.  If the connection with a video visit is poor, we may have to switch to a telephone visit.  With either a video or telephone visit, we are not always able to ensure that we have a secure connection.   I need to obtain your verbal consent now.   Are you willing to proceed with your visit today?   Sonya Niedermeier has provided verbal consent on 05/30/2021 for a virtual visit (video or telephone).   Margaretann Loveless, PA-C 05/30/2021  5:09 PM    MyChart Video Visit    Virtual Visit via Video Note   This visit type was conducted due to national recommendations for restrictions regarding the COVID-19 Pandemic (e.g. social distancing) in an effort to limit this patient's exposure and mitigate transmission in our community. This patient is at least at moderate risk for complications without adequate follow up. This format is felt to be most appropriate for this patient at this time. Physical exam was limited by quality of the video and audio  technology used for the visit.   Patient location: Home Provider location: Home office in [place]  I discussed the limitations of evaluation and management by telemedicine and the availability of in person appointments. The patient expressed understanding and agreed to proceed.  Patient: Wanda Porter   DOB: [birth year]   25 y.o. Female  MRN: [id number] Visit Date: 05/30/2021  Today's healthcare provider: Margaretann Loveless, PA-C   No chief complaint on file.  Subjective    HPI  Wanda Porter is a 25 yr old female that is 8 months pregnant presenting today via [facility] for worsening swelling of her feet and ankles. She does have a history of pre-eclampsia with a previous pregnancy. She reports she did speak with her nurse yesterday, but swelling has progressed tremendously since. She is having trouble bearing weight on her feet due to pain from the swelling.  Patient Active Problem List   Diagnosis Date Noted   Supervision of low-risk pregnancy 12/20/2020   Headache in pregnancy, antepartum, third trimester 08/19/2019   History of pre-eclampsia in prior pregnancy, currently pregnant 06/24/2019   Patient's reproductive partner has sickle cell anemia (Hgb SS) 06/24/2019   Past Medical History:  Diagnosis Date   Irregular fetal heart rate 07/04/2019   Pre-eclampsia    Single spontaneous delivery 08/25/2019   Supervision of low-risk pregnancy 06/25/2019    Nursing Staff Provider Office Location [facility]  [place]  Dating   LMP consistent with early [place] Language  English Anatomy US   Normal Flu Vaccine   Genetic Screen  NIPS:   AFP:   First Screen:  Quad:   TDaP vaccine    Hgb A1C or  GTT Early  Third trimester  Rhogam   N/A   LAB RESULTS  Feeding Plan  Breast/Bottle Blood Type --/Positive/-- (03/08 0000)  Contraception  Depo Antibody Negative (02/06 0000) Cir      Medications: Outpatient Medications Prior to Visit  Medication Sig   aspirin EC 81 MG tablet Take 1 tablet (81 mg total) by mouth  daily. Take after 12 weeks for prevention of preeclampsia later in pregnancy (after 12/26/2020). (Patient not taking: Reported on 01/18/2021)   Doxylamine-Pyridoxine 10-10 MG TBEC Take 1 tablet by mouth in the morning and at bedtime.   ondansetron (ZOFRAN ODT) 4 MG disintegrating tablet Take 1 tablet (4 mg total) by mouth every 8 (eight) hours as needed for nausea or vomiting.   prenatal vitamin w/FE, FA (NATACHEW) 29-1 MG CHEW chewable tablet Chew 1 tablet by mouth daily at 12 noon.   No facility-administered medications prior to visit.    Review of Systems  Constitutional: Negative.   Respiratory: Negative.    Cardiovascular:  Positive for leg swelling.  Musculoskeletal:  Positive for arthralgias, gait problem and myalgias.  Skin:  Negative for color change.  Neurological:  Negative for seizures, weakness and headaches.   Last CBC Lab Results  Component Value Date   WBC 4.4 12/20/2020   HGB 13.1 12/20/2020   HCT 39.9 12/20/2020   MCV 85 12/20/2020   MCH 27.9 12/20/2020   RDW 12.6 12/20/2020   PLT 277 12/20/2020   Last metabolic panel Lab Results  Component Value Date   GLUCOSE 94 01/19/2021   NA 132 (L) 01/19/2021   K 3.7 01/19/2021   CL 98 01/19/2021   CO2 21 (L) 01/19/2021   BUN 5 (L) 01/19/2021   CREATININE 0.61 01/19/2021   GFRNONAA >60 01/19/2021   GFRAA 148 12/20/2020   CALCIUM 9.5 01/19/2021   PROT 7.2 01/19/2021   ALBUMIN 3.3 (L) 01/19/2021   LABGLOB 2.5 12/20/2020   AGRATIO 1.7 12/20/2020   BILITOT 0.4 01/19/2021   ALKPHOS 43 01/19/2021   AST 25 01/19/2021   ALT 21 01/19/2021   ANIONGAP 13 01/19/2021      Objective    There were no vitals taken for this visit. BP Readings from Last 3 Encounters:  05/11/21 127/66  03/14/21 122/61  01/19/21 (!) 109/58   Wt Readings from Last 3 Encounters:  01/19/21 118 lb (53.5 kg)  01/18/21 120 lb (54.4 kg)  12/20/20 117 lb 9.6 oz (53.3 kg)      Physical Exam Vitals reviewed.  Constitutional:      General:  She is not in acute distress.    Appearance: Normal appearance. She is well-developed. She is not ill-appearing.  HENT:     Head: Normocephalic and atraumatic.  Pulmonary:     Effort: Pulmonary effort is normal. No respiratory distress.  Musculoskeletal:     Cervical back: Normal range of motion and neck supple.     Right lower leg: Edema present.     Left lower leg: Edema present.  Neurological:     Mental Status: She is alert.  Psychiatric:        Mood and Affect: Mood normal.        Behavior: Behavior normal.  Thought Content: Thought content normal.        Judgment: Judgment normal.       Assessment & Plan     1. Swelling - Due to patient with history of pre-eclampsia and having significant swelling, patient was advised to proceed to the closest UC or ER for further evaluation.  - She voiced understanding and agreed to proceed to nearest UC  2. History of pre-eclampsia in prior pregnancy, currently pregnant - See above medical treatment plan.  3. [redacted] weeks gestation of pregnancy See above medical treatment plan   No follow-ups on file.     I discussed the assessment and treatment plan with the patient. The patient was provided an opportunity to ask questions and all were answered. The patient agreed with the plan and demonstrated an understanding of the instructions.   The patient was advised to call back or seek an in-person evaluation if the symptoms worsen or if the condition fails to improve as anticipated.  I provided 6 minutes of face-to-face time during this encounter via MyChart Video enabled encounter.   Reine Just [facility] [phone] (phone) [phone] (fax)  [facility]

## 2021-05-30 NOTE — Patient Instructions (Signed)
Preeclampsia and Eclampsia  Preeclampsia is a serious condition that may develop during pregnancy. This condition involves high blood pressure during pregnancy and causes symptoms such as headaches, vision changes, and increased swelling in the legs, hands, and face. Preeclampsia occurs after 20 weeks of pregnancy. Eclampsia is a seizure that happens from worsening preeclampsia. Diagnosing and managing preeclampsia early is important. If not treated early, it can cause serious problems for mother and baby.  There is no cure for this condition. However, during pregnancy, delivering the baby may be the best treatment for preeclampsia or eclampsia. For most women, symptoms of preeclampsia and eclampsia go away after giving birth. In rare cases, a woman may develop preeclampsia or eclampsia after giving birth. This usually occurs within 48 hours after childbirth but may occur up to 6 weeks after giving birth.  What are the causes?  The cause of this condition is not known.  What increases the risk?  The following factors make you more likely to develop preeclampsia:  Being pregnant for the first time or being pregnant with multiples.  Having had preeclampsia or a condition called hemolysis, elevated liver enzymes, and low platelet count (HELLP)syndrome during a past pregnancy.  Having a family history of preeclampsia.  Being older than age 35.  Being obese.  Becoming pregnant through fertility treatments.  Conditions that reduce blood flow or oxygen to your placenta and baby may also increase your risk. These include:  High blood pressure before, during, or immediately following pregnancy.  Kidney disease.  Diabetes.  Blood clotting disorders.  Autoimmune diseases, such as lupus.  Sleep apnea.  What are the signs or symptoms?  Common symptoms of this condition include:  A severe, throbbing headache that does not go away.  Vision problems, such as blurred or double vision and light sensitivity.  Pain in the stomach,  especially the right upper region.  Pain in the shoulder.  Other symptoms that may develop as the condition gets worse include:  Sudden weight gain because of fluid buildup in the body. This causes swelling of the face, hands, legs, and feet.  Severe nausea and vomiting.  Urinating less than usual.  Shortness of breath.  Seizures.  How is this diagnosed?    Your health care provider will ask you about symptoms and check for signs of preeclampsia during your prenatal visits. You will also have routine tests, including:  Checking your blood pressure.  Urine tests to check for protein.  Blood tests to assess your organ function.  Monitoring your baby's heart rate.  Ultrasounds to check fetal growth.  How is this treated?  You and your health care provider will determine the treatment that is best for you. Treatment may include:  Frequent prenatal visits to check for preeclampsia.  Medicine to lower your blood pressure.  Medicine to prevent seizures.  Low-dose aspirin during your pregnancy.  Staying in the hospital, in severe cases. You will be given medicines to control your blood pressure and the amount of fluids in your body.  Delivering your baby.  Work with your health care provider to manage any chronic health conditions, such as diabetes or kidney problems. Also, work with your health care provider to manage weight gain during pregnancy.  Follow these instructions at home:  Eating and drinking  Drink enough fluid to keep your urine pale yellow.  Avoid caffeine. Caffeine may increase blood pressure and heart rate and lead to dehydration.  Reduce the amount of salt that you eat.  Lifestyle    Do not use any products that contain nicotine or tobacco. These products include cigarettes, chewing tobacco, and vaping devices, such as e-cigarettes. If you need help quitting, ask your health care provider.  Do not use alcohol or drugs.  Avoid stress as much as possible.  Rest and get plenty of sleep.  General  instructions    Take over-the-counter and prescription medicines only as told by your health care provider.  When lying down, lie on your left side. This keeps pressure off your major blood vessels.  When sitting or lying down, raise (elevate) your feet. Try putting pillows underneath your lower legs.  Exercise regularly. Ask your health care provider what kinds of exercise are best for you.  Check your blood pressure as often as recommended by your health care provider.  Keep all prenatal and follow-up visits. This is important.  Contact a health care provider if:  You have symptoms that may need treatment or closer monitoring. These include:  Headaches.  Stomach pain or nausea and vomiting.  Shoulder pain.  Vision problems, such as spots in front of your eyes or blurry vision.  Sudden weight gain or increased swelling in your face, hands, legs, and feet.  Increased anxiety or feeling of impending doom.  Signs or symptoms of labor.  Get help right away if:  You have any of the following symptoms:  A seizure.  Shortness of breath or trouble breathing.  Trouble speaking or slurred speech.  Fainting.  Chest pain.  These symptoms may represent a serious problem that is an emergency. Do not wait to see if the symptoms will go away. Get medical help right away. Call your local emergency services (911 in the [place] Do not drive yourself to the hospital.  Summary  Preeclampsia is a serious condition that may develop during pregnancy.  Diagnosing and treating preeclampsia early is very important.  Keep all prenatal and follow-up visits. This is important.  Get help right away if you have a seizure, shortness of breath or trouble breathing, trouble speaking or slurred speech, chest pain, or fainting.  This information is not intended to replace advice given to you by your health care provider. Make sure you discuss any questions you have with your health care provider.  Document Revised: 08/25/2020 Document Reviewed:  08/25/2020  Elsevier Patient Education  2022 [employer]

## 2021-06-01 ENCOUNTER — Encounter: Payer: Self-pay | Admitting: Family Medicine

## 2021-06-01 ENCOUNTER — Other Ambulatory Visit: Payer: Self-pay

## 2021-06-01 ENCOUNTER — Ambulatory Visit (INDEPENDENT_AMBULATORY_CARE_PROVIDER_SITE_OTHER): Payer: Medicaid Other | Admitting: Family Medicine

## 2021-06-01 VITALS — BP 117/70 | HR 88 | Wt 153.6 lb

## 2021-06-01 DIAGNOSIS — O09299 Supervision of pregnancy with other poor reproductive or obstetric history, unspecified trimester: Secondary | ICD-10-CM | POA: Diagnosis not present

## 2021-06-01 DIAGNOSIS — R519 Headache, unspecified: Secondary | ICD-10-CM | POA: Diagnosis not present

## 2021-06-01 DIAGNOSIS — Z3A34 34 weeks gestation of pregnancy: Secondary | ICD-10-CM

## 2021-06-01 DIAGNOSIS — O26893 Other specified pregnancy related conditions, third trimester: Secondary | ICD-10-CM

## 2021-06-01 DIAGNOSIS — Z789 Other specified health status: Secondary | ICD-10-CM | POA: Diagnosis not present

## 2021-06-01 DIAGNOSIS — O09293 Supervision of pregnancy with other poor reproductive or obstetric history, third trimester: Secondary | ICD-10-CM

## 2021-06-01 DIAGNOSIS — Z3490 Encounter for supervision of normal pregnancy, unspecified, unspecified trimester: Secondary | ICD-10-CM

## 2021-06-01 MED ORDER — CYCLOBENZAPRINE HCL 10 MG PO TABS: 10.0000 mg | ORAL_TABLET | Freq: Three times a day (TID) | ORAL | 1 refills | Status: DC | PRN

## 2021-06-01 MED ORDER — BLOOD PRESSURE KIT DEVI: 1.0000 | 0 refills | Status: DC | PRN

## 2021-06-01 NOTE — Progress Notes (Signed)
PRENATAL VISIT NOTE  Subjective:  Wanda Porter is a 25 y.o. [phone] at 59w3dbeing seen today for ongoing prenatal care.  She is currently monitored for the following issues for this low-risk pregnancy and has History of pre-eclampsia in prior pregnancy, currently pregnant; Patient's reproductive partner has sickle cell anemia (Hgb SS); Headache in pregnancy, antepartum, third trimester; and Supervision of low-risk pregnancy on their problem list.  Patient reports swelling in feet for about a month, worsening. Noticed hangs are swelling as well and had to cut off her rings this week. Also reports HA since early pregnancy with worsening for the past month. She had virtual visits with Family Medicine for swelling. She does not have BP cuff at home. Denies visions changes, RUQ pain.   Contractions: Not present. Vag. Bleeding: None.  Movement: Present. Denies leaking of fluid.   The following portions of the patient's history were reviewed and updated as appropriate: allergies, current medications, past family history, past medical history, past social history, past surgical history and problem list.   Objective:   Vitals:   06/01/21 1035  BP: 117/70  Pulse: 88  Weight: 153 lb 9.6 oz (69.7 kg)    Fetal Status: Fetal Heart Rate (bpm): 151 Fundal Height: 34 cm Movement: Present     General:  Alert, oriented and cooperative. Patient is in no acute distress.  Skin: Skin is warm and dry. No rash noted.   Cardiovascular: Normal heart rate noted  Respiratory: Normal respiratory effort, no problems with respiration noted  Abdomen: Soft, gravid, appropriate for gestational age.  Pain/Pressure: Present     Pelvic: Cervical exam deferred        Extremities: Normal range of motion.  Edema: Deep pitting, indentation remains for a short time  Mental Status: Normal mood and affect. Normal behavior. Normal judgment and thought content.   Assessment and Plan:  Pregnancy: [id number] 354w3d1.  Encounter for supervision of low-risk pregnancy, antepartum Patient is very worried about preeclampsia "cause she feels the same as with her son" and she says "her other doctor told her is had preeclampsia." She reports "she can't last another 5 weeks with this swelling cause it hurt"  I discussed I am worried about PEC and her BP is normal today and since she had virtual visits without BP checks that those providers cannot diagnose her with PEC.  I have ordered preeclampsia labs despite normal BP to work up her swelling She also has only had 1 prior visit with our office and instead was only seeing MFM for [place] she has had three Ultrasounds. I ordered some routine OB labs today and plan for 2hr at next visit which is the earliest the patient can return for a longer GTT.   I did not address her desire for WB today as there was not clinical time.    2. History of pre-eclampsia in prior pregnancy, currently pregnant - CBC - HIV Antibody (routine testing w rflx) - RPR - Comprehensive metabolic panel - Protein / creatinine ratio, urine - Blood Pressure Monitoring (BLOOD PRESSURE KIT) DEVI; 1 Device by Does not apply route as needed.  Dispense: 1 each; Refill: 0  3. Patient's reproductive partner has sickle cell anemia (Hgb SS)   4. Headache in pregnancy, antepartum, third trimester HA were preexisting to swelling Use tylenol and sleeps and they go away Reviewed use of flexeril - cyclobenzaprine (FLEXERIL) 10 MG tablet; Take 1 tablet (10 mg total) by mouth every 8 (eight) hours as  needed for muscle spasms.  Dispense: 30 tablet; Refill: 1   Preterm labor symptoms and general obstetric precautions including but not limited to vaginal bleeding, contractions, leaking of fluid and fetal movement were reviewed in detail with the patient. Please refer to After Visit Summary for other counseling recommendations.   Return in about 1 week (around 06/08/2021) for Routine prenatal care, in person, MD  or APP- needs 2hr GTT .  Future Appointments  Date Time Provider [place]  06/12/2021  9:00 AM Caren Macadam, MD [facility]  06/12/2021  9:30 AM [facility]    Caren Macadam, MD

## 2021-06-02 LAB — RPR: RPR Ser Ql: NONREACTIVE

## 2021-06-02 LAB — CBC
Hematocrit: 35.1 % (ref 34.0–46.6)
Hemoglobin: 11.4 g/dL (ref 11.1–15.9)
MCH: 28.2 pg (ref 26.6–33.0)
MCHC: 32.5 g/dL (ref 31.5–35.7)
MCV: 87 fL (ref 79–97)
Platelets: 198 10*3/uL (ref 150–450)
RBC: 4.04 x10E6/uL (ref 3.77–5.28)
RDW: 13.5 % (ref 11.7–15.4)
WBC: 7.4 10*3/uL (ref 3.4–10.8)

## 2021-06-02 LAB — COMPREHENSIVE METABOLIC PANEL
ALT: 5 IU/L (ref 0–32)
AST: 11 IU/L (ref 0–40)
Albumin/Globulin Ratio: 1.6 (ref 1.2–2.2)
Albumin: 3.6 g/dL — ABNORMAL LOW (ref 3.9–5.0)
Alkaline Phosphatase: 184 IU/L — ABNORMAL HIGH (ref 44–121)
BUN/Creatinine Ratio: 6 — ABNORMAL LOW (ref 9–23)
BUN: 3 mg/dL — ABNORMAL LOW (ref 6–20)
Bilirubin Total: <0.2 mg/dL (ref 0.0–1.2)
CO2: 18 mmol/L — ABNORMAL LOW (ref 20–29)
Calcium: 9 mg/dL (ref 8.7–10.2)
Chloride: 108 mmol/L — ABNORMAL HIGH (ref 96–106)
Creatinine, Ser: 0.49 mg/dL — ABNORMAL LOW (ref 0.57–1.00)
Globulin, Total: 2.3 g/dL (ref 1.5–4.5)
Glucose: 79 mg/dL (ref 65–99)
Potassium: 4.2 mmol/L (ref 3.5–5.2)
Sodium: 140 mmol/L (ref 134–144)
Total Protein: 5.9 g/dL — ABNORMAL LOW (ref 6.0–8.5)
eGFR: 135 mL/min/{1.73_m2} (ref >59)

## 2021-06-02 LAB — PROTEIN / CREATININE RATIO, URINE
Creatinine, Urine: 57.6 mg/dL
Protein, Ur: 8.5 mg/dL
Protein/Creat Ratio: 148 mg/g creat (ref 0–200)

## 2021-06-02 LAB — HIV ANTIBODY (ROUTINE TESTING W REFLEX): HIV Screen 4th Generation wRfx: NONREACTIVE

## 2021-06-12 ENCOUNTER — Other Ambulatory Visit: Payer: Medicaid Other

## 2021-06-12 ENCOUNTER — Other Ambulatory Visit: Payer: Self-pay

## 2021-06-12 ENCOUNTER — Encounter: Payer: Medicaid Other | Admitting: Family Medicine

## 2021-06-12 DIAGNOSIS — Z3493 Encounter for supervision of normal pregnancy, unspecified, third trimester: Secondary | ICD-10-CM

## 2021-06-12 NOTE — Progress Notes (Deleted)
Called pt and left VM for missed appt.

## 2021-06-16 ENCOUNTER — Other Ambulatory Visit: Payer: Medicaid Other

## 2021-06-21 ENCOUNTER — Telehealth: Payer: Self-pay | Admitting: Family Medicine

## 2021-06-21 NOTE — Telephone Encounter (Signed)
Patient is calling requesting a Voluntary induction and she also said she's not sure if her Water broke but she is having some discharge, state she called yesterday and a nurse was suppose to call her back

## 2021-06-21 NOTE — Telephone Encounter (Signed)
Returned patients call.   Patient reports she is wanting an induction. Reviewed it is not recommended induce without a medical reason. Patient reports she is getting mixed messages about if she has preeclampsia and that the baby is measuring larger. She later said she is not currently asking for an induction, she is wanting to get one scheduled in the next week or so.   She reports she went to the [facility] for contractions and was told she needs an induction. She reports she is having contractions and is not dilating and the swelling is extreme. She reports she was also informed by an after hours nurse that she needs an induction. Reviewed that the doctor is the only one who can recommend an induction.   Patient is concerned that she is not dilating, reviewed that at 37 weeks a person may not be dilated yet. She indicated that she was informed she was effacing, reviewed that indicates her cervix is changing and getting ready for delivery.   Patient is concerned she is leaking something from her vagina. The discharge is clear and white and starts and stops. She feels like she is peeing on herself. She is losing her mucous plug today. Recommended she come into the office just after lunch to be checked for rupture and if not able to come in, it is recommended that she to go to MAU for evaluation. Patient reports she cannot come in as she is at work. She reports she will go to the hospital for evaluation. Reviewed it is best to check for a rupture as that would indicate need for delivery and possible induction.   Patient voiced concerns that she has only been seen in the office 2 times during the pregnancy. Reviewed with patient that she has had appointment where she has not shown up and those appointments needed to be rescheduled. She reports she has been seen in MFM, reviewed that patients still need to be seen in the office.   Patient has not had swabs for 36 weeks, reviewed we can also perform these in  the office if she can come in for a visit.   Patient asking how she can transfer practices, advised that she would need to call other offices to see if they will accept her and then she can have her records transferred.

## 2021-06-30 ENCOUNTER — Telehealth: Payer: Self-pay | Admitting: *Deleted

## 2021-06-30 ENCOUNTER — Encounter: Payer: Self-pay | Admitting: Medical

## 2021-06-30 ENCOUNTER — Telehealth: Payer: Self-pay | Admitting: Medical

## 2021-06-30 ENCOUNTER — Encounter: Payer: Medicaid Other | Admitting: Medical

## 2021-06-30 NOTE — Telephone Encounter (Signed)
Patient called office this morning with concerns for abdominal pain and vaginal bleeding. RN advised patient to go to MAU or nearest ER for evaluation. Patient requested that I call her to discuss. I advised RN that patient should go to the MAU or ER since I would not be able to contact her until after clinic this morning. I attempted to call patient today at 1413 and LM to return call to my office at [facility] to discuss her concerns. At time of chart review, patient has not presented to the MAU for evaluation.   Vonzella Nipple, PA-C 06/30/2021 2:28 PM

## 2021-06-30 NOTE — Telephone Encounter (Signed)
Wanda Porter called front desk and asked to speak with nurse. She reports bleeding like a period with clots since 6am. States she is contracting every 5 minutes . Confirms good fetal movement. States she has been to hospital several times lately and sent home. States she has appointment today and getting IOL scheduled. I informed patient she needs to go to the hospital asap to be evaluated for placental issues which can be life threatening or fatal to baby . She reports but if she missed her appointment how will she get IOL scheduled. I informed her she needs to go to the hospital asap, this is emergency because bleeding at [redacted]w[redacted]d is not normal and can indicate a serious issue with placenta and be life threatening to  her baby. She still was hesitatant to go to hospital. I asked if she wanted provider opinion and she states yes. I confirmed with stafff she needs to go to hospital now.  I informed her she needs to go to the hospital NOW.  She reports she lives 30 minutes from [place]. I instructed her to go to nearest hospital which is [facility]. She still voices concerns re: IOL. I informed her she may be having her baby today/ this weekend. I informed her if she is evaluated and everything is ok and she is d/c'd she can call or message [place] and we will schedule IOL.Marland Kitchen she voices understanding. Lianna Sitzmann,RN

## 2021-06-30 NOTE — Telephone Encounter (Signed)
Remmy called again and asked to speak with nurse. She states she is not going to [facility] but is on her way to [place]. I informed her it is her choice which hospital she is going to ; but we recommend closest hospital. She reports the bleeding stopped and she feels she can be evaluated at office. I informed her she cannot be evaluated here for bleeding - she will need an ultrasound and we can't do that in the office. She insisted she speak with doctor.I informed her the provider has full assignment and may be with another patient but I will go and see. I pulled Vonzella Nipple, [place] out of a pt room and reviewed with her. I informed Ciria provider still recommends she go to hospital for evaluation because we can't do that here.  She cannot talk with her now , but can call her when possible, but may not be until 71- recommends she go to hospital NOW for evaluation. Nethra voices understanding but still wants to get IOL scheduled. I again informed her she could be having a baby this weekend . If she does not have her baby we can talk to  her about IOL for next week. We can follow up Monday. Cary Wilford,RN

## 2021-07-04 ENCOUNTER — Encounter: Payer: Medicaid Other | Admitting: Student

## 2021-07-10 ENCOUNTER — Inpatient Hospital Stay (HOSPITAL_COMMUNITY): Admit: 2021-07-10 | Payer: Self-pay

## 2021-08-10 ENCOUNTER — Ambulatory Visit: Payer: Medicaid Other | Admitting: Obstetrics and Gynecology

## 2021-08-14 ENCOUNTER — Encounter: Payer: Self-pay | Admitting: Obstetrics and Gynecology

## 2021-08-14 ENCOUNTER — Ambulatory Visit: Payer: Medicaid Other | Admitting: Obstetrics and Gynecology

## 2021-08-14 NOTE — Progress Notes (Signed)
Patient did not keep her postpartum appointment for 08/14/2021.  Cornelia Copa MD Attending Center for [employer] [facility])

## 2021-08-22 ENCOUNTER — Ambulatory Visit: Payer: Medicaid Other | Admitting: Nurse Practitioner

## 2021-09-18 ENCOUNTER — Ambulatory Visit (INDEPENDENT_AMBULATORY_CARE_PROVIDER_SITE_OTHER): Payer: Medicaid Other | Admitting: Medical

## 2021-09-18 ENCOUNTER — Ambulatory Visit: Payer: Medicaid Other

## 2021-09-18 ENCOUNTER — Other Ambulatory Visit: Payer: Self-pay

## 2021-09-18 ENCOUNTER — Encounter: Payer: Self-pay | Admitting: Medical

## 2021-09-18 VITALS — BP 139/86 | HR 78 | Ht 60.0 in | Wt 133.0 lb

## 2021-09-18 DIAGNOSIS — Z3009 Encounter for other general counseling and advice on contraception: Secondary | ICD-10-CM

## 2021-09-18 DIAGNOSIS — N939 Abnormal uterine and vaginal bleeding, unspecified: Secondary | ICD-10-CM

## 2021-09-18 DIAGNOSIS — Z3042 Encounter for surveillance of injectable contraceptive: Secondary | ICD-10-CM

## 2021-09-18 LAB — CBC
Hematocrit: 39 % (ref 34.0–46.6)
Hemoglobin: 12.7 g/dL (ref 11.1–15.9)
MCH: 27.4 pg (ref 26.6–33.0)
MCHC: 32.6 g/dL (ref 31.5–35.7)
MCV: 84 fL (ref 79–97)
Platelets: 275 10*3/uL (ref 150–450)
RBC: 4.64 x10E6/uL (ref 3.77–5.28)
RDW: 13.5 % (ref 11.7–15.4)
WBC: 5.3 10*3/uL (ref 3.4–10.8)

## 2021-09-18 MED ORDER — MEDROXYPROGESTERONE ACETATE 150 MG/ML IM SUSP
150.0000 mg | Freq: Once | INTRAMUSCULAR | Status: AC
  Administered 2021-09-18: 150 mg via INTRAMUSCULAR

## 2021-09-18 NOTE — Progress Notes (Signed)
Post Partum Visit Note  Wanda Porter is a 25 y.o. [phone] female who presents for a postpartum visit. She is  12  weeks postpartum following a normal spontaneous vaginal delivery.  I have fully reviewed the prenatal and intrapartum course. The delivery was at 38 gestational weeks.  Anesthesia: none. Postpartum course has been blood loss and pain. Baby is doing well. Baby is feeding by bottle - Enfamil Lipil. Bleeding moderate lochia. Bowel function is normal. Bladder function is normal. Patient is sexually active. Contraception method is Depo-Provera injections. Postpartum depression screening: negative.   The pregnancy intention screening data noted above was reviewed. Potential methods of contraception were discussed. The patient elected to proceed with No data recorded.   Edinburgh Postnatal Depression Scale - 09/18/21 1357       Edinburgh Postnatal Depression Scale:  In the Past 7 Days   I have been able to laugh and see the funny side of things. 0    I have looked forward with enjoyment to things. 0    I have blamed myself unnecessarily when things went wrong. 0    I have been anxious or worried for no good reason. 2    I have felt scared or panicky for no good reason. 2    Things have been getting on top of me. 0    I have been so unhappy that I have had difficulty sleeping. 0    I have felt sad or miserable. 0    I have been so unhappy that I have been crying. 0    The thought of harming myself has occurred to me. 0    Edinburgh Postnatal Depression Scale Total 4             Health Maintenance Due  Topic Date Due   HPV VACCINES (1 - 2-dose series) Never done    The following portions of the patient's history were reviewed and updated as appropriate: allergies, current medications, past family history, past medical history, past social history, past surgical history, and problem list.  Review of Systems Pertinent items are noted in HPI.  Objective:  BP 137/83    Pulse 66   Ht 5' (1.524 m)   Wt 133 lb (60.3 kg)   LMP  (LMP Unknown)   Breastfeeding No   BMI 25.97 kg/m    General:  alert and cooperative   Breasts:  not indicated  Lungs: clear to auscultation bilaterally  Heart:  regular rate and rhythm, S1, S2 normal, no murmur, click, rub or gallop  Abdomen: normal findings: soft, non-tender   Wound N/A  GU exam:  not indicated       Assessment:   Routine postpartum exam.  Vaginal bleeding  Depo Provera contraceptive status   Plan:   Essential components of care per ACOG recommendations:  1.  Mood and well being: Patient with negative depression screening today. Reviewed local resources for support.  - Patient tobacco use? Yes. Patient desires to quit? No.   - hx of drug use? No.    2. Infant care and feeding:  -Patient currently breastmilk feeding? No.  -Social determinants of health (SDOH) reviewed in EPIC. No concerns.   3. Sexuality, contraception and birth spacing - Patient does not want a pregnancy in the next year.  Desired family size is 2 children.  - Reviewed forms of contraception in tiered fashion. Patient desired Depo-Provera today. Patient had initial dose prior to discharge from [facility] - Was considering switching  to Nexplanon, discussed side effects of irregular bleeding, patient will stay with Depo Provera and get second dose today.  - Discussed birth spacing of 18 months  4. Sleep and fatigue -Encouraged family/partner/community support of 4 hrs of uninterrupted sleep to help with mood and fatigue  5. Physical Recovery  - Discussed patients delivery and complications. She describes her labor as mixed. - Patient had a Vaginal, no problems at delivery. Patient had a  unsure  laceration. Perineal healing reviewed. Patient expressed understanding - Patient has urinary incontinence? No. - Patient is not safe to resume physical and sexual activity  6.  Health Maintenance - HM due items addressed Yes - Last pap  smear No results found for: DIAGPAP Pap smear not done at today's visit. Records show last normal pap smear 05/2019 -Breast Cancer screening indicated? No.   7. Chronic Disease/Pregnancy Condition follow up: Anemia - CBC today  - Encouraged PCP follow-up for primary care moving forward, wants to be in [place].   Vonzella Nipple, PA-C Center for [employer], [facility]

## 2021-12-04 ENCOUNTER — Ambulatory Visit: Payer: Medicaid Other

## 2021-12-06 ENCOUNTER — Ambulatory Visit: Payer: Medicaid Other

## 2021-12-13 ENCOUNTER — Ambulatory Visit (INDEPENDENT_AMBULATORY_CARE_PROVIDER_SITE_OTHER): Payer: Medicaid Other

## 2021-12-13 ENCOUNTER — Other Ambulatory Visit: Payer: Self-pay

## 2021-12-13 VITALS — BP 129/84 | HR 86 | Wt 128.2 lb

## 2021-12-13 DIAGNOSIS — Z3042 Encounter for surveillance of injectable contraceptive: Secondary | ICD-10-CM | POA: Diagnosis not present

## 2021-12-13 MED ORDER — MEDROXYPROGESTERONE ACETATE 150 MG/ML IM SUSP
150.0000 mg | Freq: Once | INTRAMUSCULAR | Status: AC
  Administered 2021-12-13: 11:00:00 150 mg via INTRAMUSCULAR

## 2021-12-13 NOTE — Progress Notes (Signed)
Wanda Porter here for Depo-Provera Injection. Injection administered without complication. Patient will return in 3 months for next injection between 02/28/22 and 03/14/22. Next annual visit due October 2023. Patient requests full STD screening at next Depo appt; added to appt note.  Marjo Bicker, RN 12/13/2021  10:32 AM

## 2021-12-20 NOTE — Progress Notes (Signed)
Patient was assessed and managed by nursing staff during this encounter. I have reviewed the chart and agree with the documentation and plan. I have also made any necessary editorial changes.  Emeterio Reeve, MD 12/20/2021 10:37 AM

## 2022-01-31 ENCOUNTER — Ambulatory Visit: Payer: Medicaid Other

## 2022-02-22 ENCOUNTER — Ambulatory Visit (INDEPENDENT_AMBULATORY_CARE_PROVIDER_SITE_OTHER): Payer: Medicaid Other

## 2022-02-22 ENCOUNTER — Other Ambulatory Visit: Payer: Self-pay

## 2022-02-22 ENCOUNTER — Ambulatory Visit: Payer: Medicaid Other

## 2022-02-22 VITALS — BP 123/98 | HR 77 | Wt 128.4 lb

## 2022-02-22 DIAGNOSIS — Z3042 Encounter for surveillance of injectable contraceptive: Secondary | ICD-10-CM

## 2022-02-22 MED ORDER — MEDROXYPROGESTERONE ACETATE 150 MG/ML IM SUSP
150.0000 mg | Freq: Once | INTRAMUSCULAR | Status: AC
  Administered 2022-02-22: 15:00:00 150 mg via INTRAMUSCULAR

## 2022-02-22 NOTE — Progress Notes (Signed)
Wanda Porter here for Depo-Provera Injection. Injection administered without complication. Patient will return in 3 months for next injection between 05/10/22 and 05/24/22. Next annual visit due October 2023.  ? ?Patient is hoping to switch to longer acting form of contraception. Plans to transfer to [place] office due to now living in [place]. Encouraged pt to schedule appt with provider to discuss birth control options.  ? ?BP is elevated today. Patient does not have PCP. Reviewed availability of appointments with Primary Care at [facility]. Pt plans to call for appt, does not wish to schedule today. ? ?Marjo Bicker, RN ?02/22/2022  2:47 PM ? ? ?

## 2022-02-28 ENCOUNTER — Ambulatory Visit: Payer: Medicaid Other

## 2022-03-30 ENCOUNTER — Ambulatory Visit: Payer: Medicaid Other | Admitting: Women's Health

## 2022-04-13 ENCOUNTER — Encounter: Payer: Self-pay | Admitting: Women's Health

## 2022-04-13 ENCOUNTER — Ambulatory Visit: Payer: Medicaid Other | Admitting: Women's Health

## 2022-04-13 VITALS — BP 130/82 | HR 89 | Ht 61.0 in | Wt 133.0 lb

## 2022-04-13 DIAGNOSIS — Z30017 Encounter for initial prescription of implantable subdermal contraceptive: Secondary | ICD-10-CM | POA: Diagnosis not present

## 2022-04-13 LAB — POCT URINE PREGNANCY: Preg Test, Ur: NEGATIVE

## 2022-04-13 MED ORDER — ETONOGESTREL 68 MG ~~LOC~~ IMPL
68.0000 mg | DRUG_IMPLANT | Freq: Once | SUBCUTANEOUS | Status: AC
  Administered 2022-04-13: 68 mg via SUBCUTANEOUS

## 2022-04-13 NOTE — Patient Instructions (Signed)
You have had the Nexplanon inserted today. It is good for 3 years and will need to be removed or replaced by 04/13/2025. You are not fully protected from pregnancy until it has been in place for 7 days. If you have intercourse in the next 7 days, be sure to use a backup method for protection, such as a condom, or do not have sex for the next 7 days. Keep the outer bandage on and keep it clean and dry for the next 24 hours. Tomorrow morning, you can remove the bandage and shower as usual. ? ?The stickers on the skin will fall off on their own over the next 1-2 weeks. Do not peel them off. Do not soak your arm (no bath tubs, hot tubs, swimming pools, etc.) until the incision has completely healed, usually within about 1-2 weeks. If you notice any signs of infection (increased pain, redness, warmth, drainage, fever above 100.4 degrees), call back to the office immediately. ? ? ? ? ? ? ? ?

## 2022-04-13 NOTE — Progress Notes (Signed)
Ms. Wanda Porter is a 26 y.o. [phone] here for Nexplanon insertion with no c/o. UPT today in office negative. Pt currently on Depo, but reports mood changes and is not due for injection for 4 weeks. Informed consent document signed. Pt has NKDA. Nexplanon insertion side identified on the inner aspect of non-dominant upper arm, overlying the triceps muscle. ? ?BP 130/82   Pulse 89   Ht 5\' 1"  (1.549 m)   Wt 133 lb (60.3 kg)   BMI 25.13 kg/m?  ? ?Insertion site measured 8-10cm from medial epicondyle of humerus and 3-5cm posterior to the sulcus between the between biceps and triceps. With a surgical marker, insertion site marked. Confirmed site was marked in correct location. Skin cleansed with betadine. ? ?Insertion area anesthetized with 42mL of 1% lidocaine just under the skin along planned insertion tunnel. Sterility of Nexplanon device confirmed, and implant visualized in insertion needle. Skin stretched around insertion site towards elbow. Positioned self to visualize insertion from the side. Skin punctured with tip of needle slightly angled at less then 30degrees until bevel just under skin. ? ?Applicator lowered to nearly horizontal position, and skin tented with needle during insertion and full length of needle inserted. Slight resistance felt - no excessive force necessary. Applicator held steady while slider moved back without difficulty and Nexplanon dispensed into arm. Implant not protruding from insertion site and applicator checked to confirm release of Nexplanon. ? ?Steri-strip applied to incision site. Clinician and patient both able to easily palpate Nexplanon along entire length of rod after insertion. Bandaid applied over steri-strip. Pressure dressing applied with sterile gauze. Pt instructed to leave pressure dressing on for 24hrs, steri-strip for 3-5days. Wash area with soap and water by hand for 5days. Advised to refrain from intercourse or use condoms for next 7 days. User Card completed and  given to patient. Patient Chart Label affixed to informed consent. ? ?3m, NP  ?11:14 AM ?04/13/2022 ?

## 2022-05-10 ENCOUNTER — Ambulatory Visit: Payer: Medicaid Other

## 2022-07-10 IMAGING — US US MFM OB FOLLOW-UP
1 series · 13 of 28 positions shown · non-contrast
Comparison: none

[Series 1: us mfm ob follow-up · 13 of 54 slices shown]
[im 2/54]
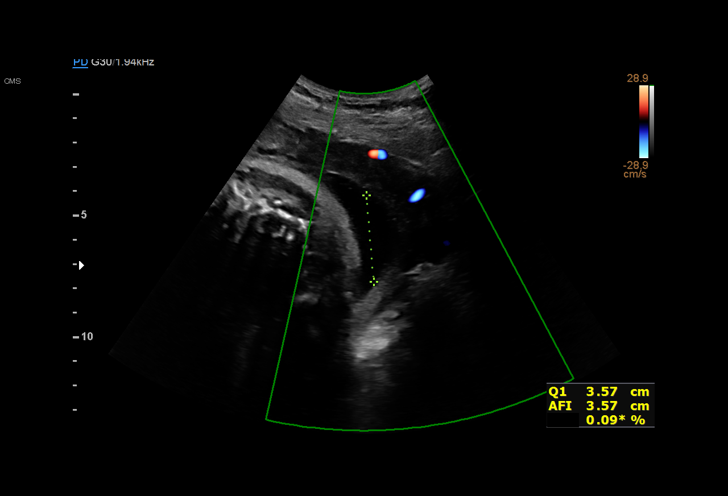
[im 6/54]
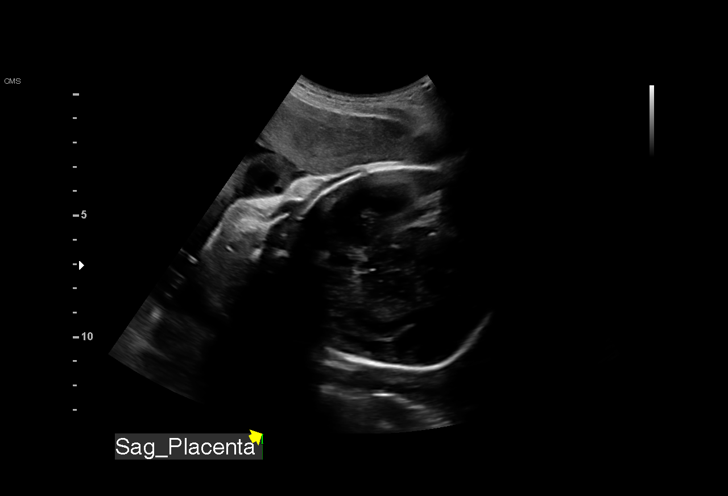
[im 10/54]
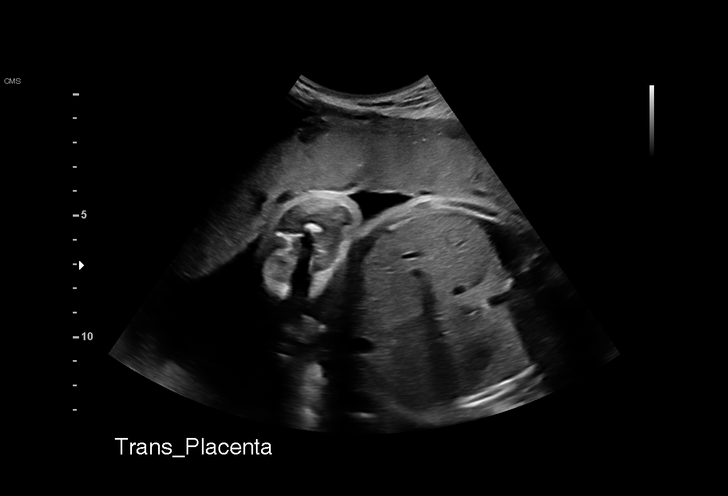
[im 14/54]
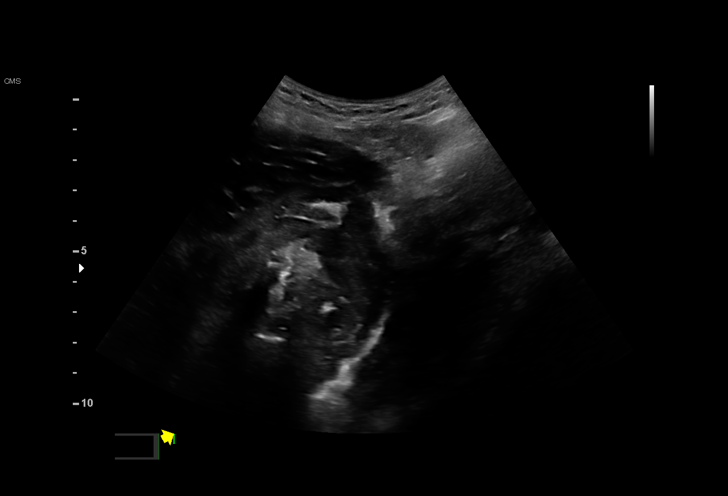
[im 18/54]
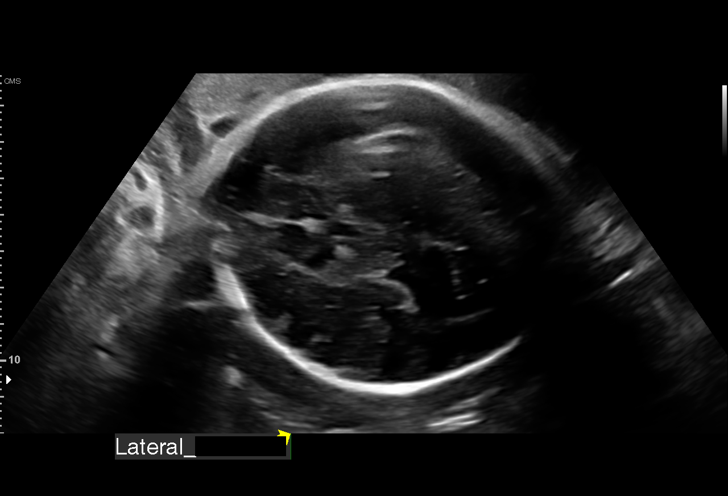
[im 22/54]
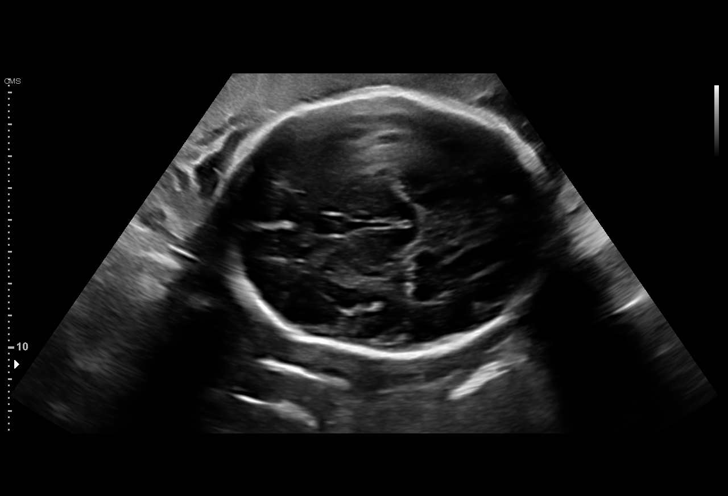
[im 28/54]
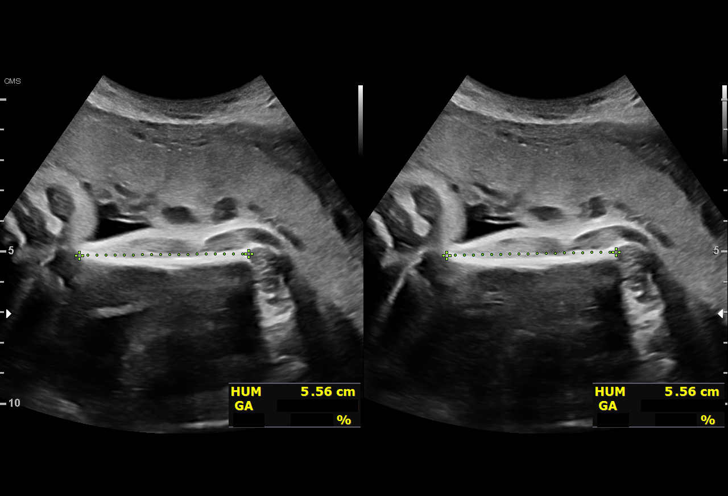
[im 32/54]
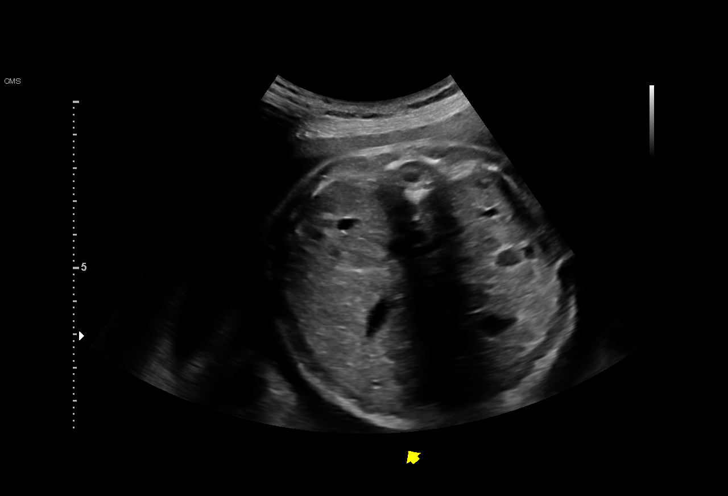
[im 36/54]
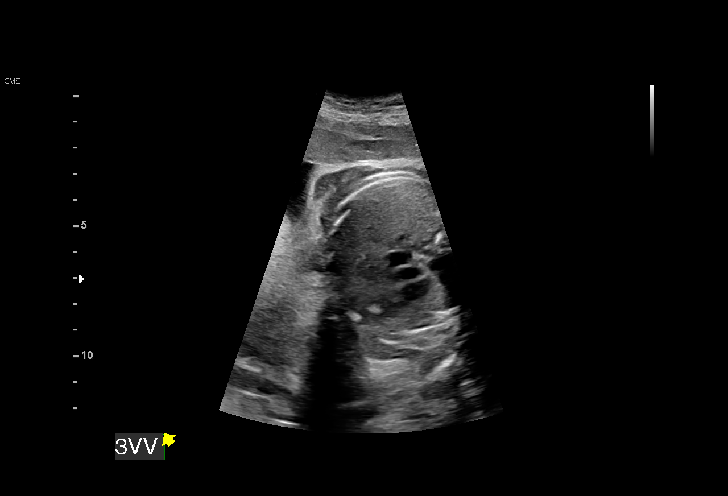
[im 40/54]
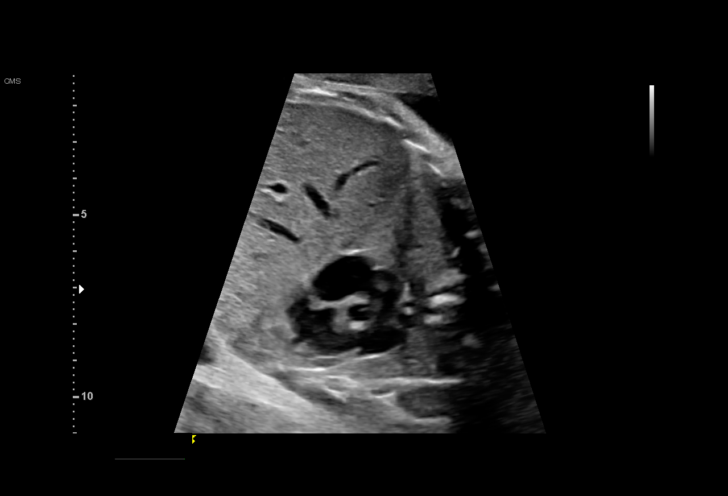
[im 44/54]
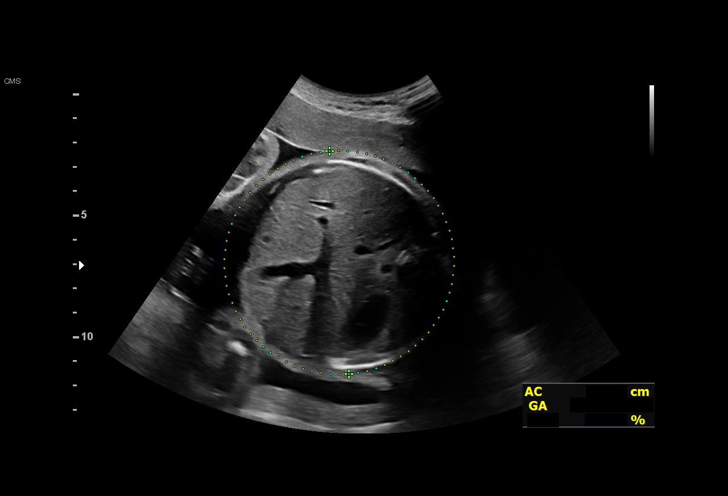
[im 48/54]
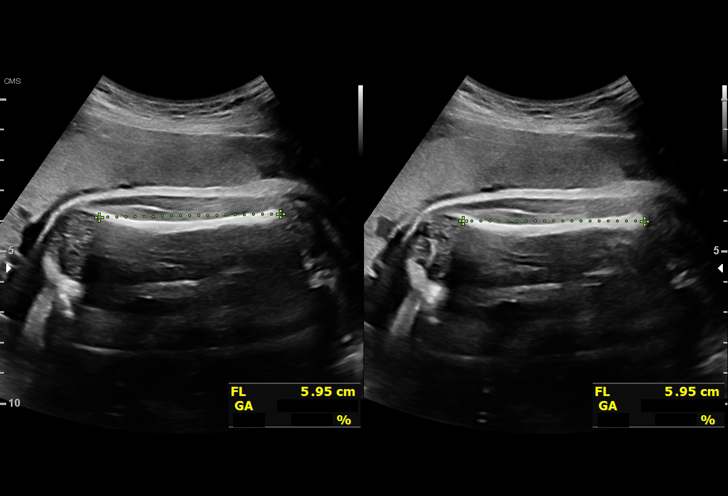
[im 52/54]
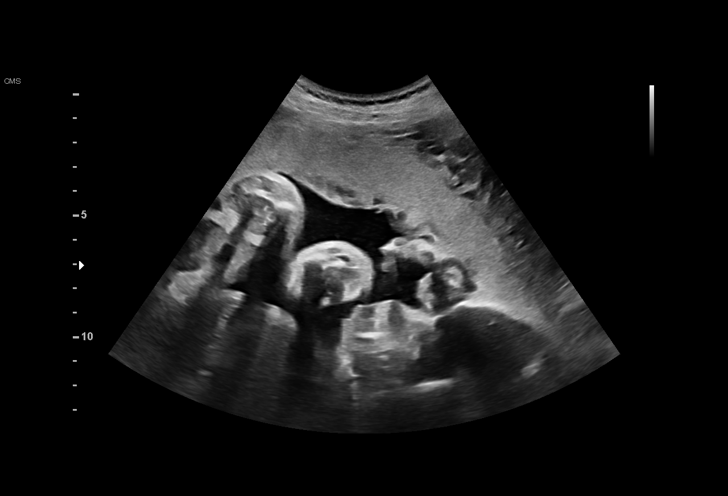

[13 of 28 positions shown; findings below may reference images not displayed]

[facility]                                 [HOSPITAL] at

Indications

 31 weeks gestation of pregnancy
 Poor obstetric history: Previous
 preeclampsia / eclampsia/gestational HTN
 Encounter for other antenatal screening
 follow-up
Fetal Evaluation

 Num Of Fetuses:         1
 Fetal Heart Rate(bpm):  144
 Cardiac Activity:       Observed
 Presentation:           Cephalic
 Placenta:               Anterior
 P. Cord Insertion:      Visualized

 Amniotic Fluid
 AFI FV:      Within normal limits

 AFI Sum(cm)     %Tile       Largest Pocket(cm)
 18.7            70

 RUQ(cm)       RLQ(cm)       LUQ(cm)        LLQ(cm)

Biometry

 BPD:      82.4  mm     G. Age:  33w 1d         86  %    CI:           74   %    70 - 86
                                                         FL/HC:      19.7   %    19.3 -
 HC:      304.2  mm     G. Age:  33w 6d         78  %    HC/AC:      1.04        0.96 -
 AC:      292.7  mm     G. Age:  33w 2d         91  %    FL/BPD:     72.6   %    71 - 87
 FL:       59.8  mm     G. Age:  31w 1d         28  %    FL/AC:      20.4   %    20 - 24
 HUM:      55.4  mm     G. Age:  32w 2d         67  %

 LV:        4.9  mm

 Est. FW:    3231  gm      4 lb 8 oz     80  %
OB History

 Gravidity:    4         Term:   2        Prem:   0        SAB:   0
 TOP:          1       Ectopic:  0        Living: 2
Gestational Age

 U/S Today:     32w 6d                                        EDD:   06/30/21
 Best:          31w 3d     Det. By:  Early Ultrasound         EDD:   07/10/21
                                     (11/22/20)
Anatomy

 Cranium:               Appears normal         Aortic Arch:            Appears normal
 Cavum:                 Appears normal         Ductal Arch:            Previously seen
 Ventricles:            Appears normal         Diaphragm:              Previously seen
 Choroid Plexus:        Appears normal         Stomach:                Appears normal, left
                                                                       sided
 Cerebellum:            Appears normal         Abdomen:                Previously seen
 Posterior Fossa:       Appears normal         Abdominal Wall:         Previously seen
 Nuchal Fold:           Not applicable (>20    Cord Vessels:           Previously seen
                        wks GA)
 Face:                  Orbits and profile     Kidneys:                Appear normal
                        previously seen
 Lips:                  Appears normal         Bladder:                Appears normal
 Thoracic:              Appears normal         Spine:                  Previously seen
 Heart:                 Previously seen        Upper Extremities:      Previously seen
 RVOT:                  Appears normal         Lower Extremities:      Previously seen
 LVOT:                  Appears normal

 Other:  Male gender previously seen. Heels and 5th digit previously
         visualized. Technically difficult due to fetal position. IVC/SVC and 3VV
         visualized.
Cervix Uterus Adnexa

 Cervix
 Not visualized (advanced GA >45wks)

 Uterus
 No abnormality visualized.

 Right Ovary
 Within normal limits.

 Left Ovary
 Within normal limits.

 Cul De Sac
 No free fluid seen.
 Adnexa
 No abnormality visualized.
Impression

 Patient returned for fetal growth assessment.  On previous
 ultrasound estimated fetal weight was at greater than the
 98th percentile.
 Fetal growth is appropriate for gestational age.  Amniotic fluid
 is normal and good fetal activity seen.
 Patient had missed her prenatal visit appointments.  I
 encouraged her to have regular prenatal visit appointments.
 She is yet to screen for gestational diabetes.
 Blood pressure today at her office is 127/66 mmHg.
Recommendations

 Follow-up scans as clinically indicated.
                 Natri, Roosa-M

## 2024-03-11 ENCOUNTER — Other Ambulatory Visit (HOSPITAL_COMMUNITY)
Admission: RE | Admit: 2024-03-11 | Discharge: 2024-03-11 | Disposition: A | Discharge status: Home / Self Care | Source: Ambulatory Visit | Attending: Family Medicine | Admitting: Family Medicine

## 2024-03-11 ENCOUNTER — Other Ambulatory Visit: Payer: Self-pay

## 2024-03-11 ENCOUNTER — Ambulatory Visit: Admitting: *Deleted

## 2024-03-11 VITALS — BP 126/73 | HR 83 | Ht 60.0 in | Wt 135.8 lb

## 2024-03-11 DIAGNOSIS — Z202 Contact with and (suspected) exposure to infections with a predominantly sexual mode of transmission: Secondary | ICD-10-CM | POA: Diagnosis present

## 2024-03-11 LAB — CERVICOVAGINAL ANCILLARY ONLY
Bacterial Vaginitis (gardnerella): POSITIVE — AB
Candida Glabrata: NEGATIVE
Candida Vaginitis: NEGATIVE
Chlamydia: NEGATIVE
Comment: NEGATIVE
Comment: NEGATIVE
Comment: NEGATIVE
Comment: NEGATIVE
Comment: NEGATIVE
Comment: NORMAL
Neisseria Gonorrhea: NEGATIVE
Trichomonas: NEGATIVE

## 2024-03-11 NOTE — Progress Notes (Signed)
 Here for std check. States she likes to do every 6 months or so and wants full check. States she has a new sexual partner. Explained will contact her  if anything positive . Self swab obtained and blood work. Nancy Fetter

## 2024-03-12 ENCOUNTER — Encounter: Payer: Self-pay | Admitting: Obstetrics and Gynecology

## 2024-03-12 ENCOUNTER — Other Ambulatory Visit: Payer: Self-pay | Admitting: Obstetrics and Gynecology

## 2024-03-12 DIAGNOSIS — B9689 Other specified bacterial agents as the cause of diseases classified elsewhere: Secondary | ICD-10-CM

## 2024-03-12 MED ORDER — METRONIDAZOLE 500 MG PO TABS: 500.0000 mg | ORAL_TABLET | Freq: Two times a day (BID) | ORAL | 0 refills | Status: AC

## 2024-03-13 ENCOUNTER — Encounter: Payer: Self-pay | Admitting: Obstetrics and Gynecology

## 2024-03-13 LAB — RPR: RPR Ser Ql: NONREACTIVE

## 2024-03-13 LAB — HEPATITIS C ANTIBODY: Hep C Virus Ab: NONREACTIVE

## 2024-03-13 LAB — HIV ANTIBODY (ROUTINE TESTING W REFLEX): HIV Screen 4th Generation wRfx: NONREACTIVE

## 2024-03-13 LAB — HEPATITIS B SURFACE ANTIGEN: Hepatitis B Surface Ag: NEGATIVE

## 2024-07-02 ENCOUNTER — Emergency Department (HOSPITAL_BASED_OUTPATIENT_CLINIC_OR_DEPARTMENT_OTHER)

## 2024-07-02 ENCOUNTER — Other Ambulatory Visit: Payer: Self-pay

## 2024-07-02 ENCOUNTER — Telehealth (HOSPITAL_BASED_OUTPATIENT_CLINIC_OR_DEPARTMENT_OTHER): Payer: Self-pay | Admitting: Emergency Medicine

## 2024-07-02 ENCOUNTER — Encounter (HOSPITAL_BASED_OUTPATIENT_CLINIC_OR_DEPARTMENT_OTHER): Payer: Self-pay

## 2024-07-02 ENCOUNTER — Emergency Department (HOSPITAL_BASED_OUTPATIENT_CLINIC_OR_DEPARTMENT_OTHER)
Admission: EM | Admit: 2024-07-02 | Discharge: 2024-07-02 | Disposition: A | Discharge status: Home / Self Care | Attending: Emergency Medicine | Admitting: Emergency Medicine

## 2024-07-02 DIAGNOSIS — N12 Tubulo-interstitial nephritis, not specified as acute or chronic: Secondary | ICD-10-CM | POA: Diagnosis not present

## 2024-07-02 DIAGNOSIS — Z87891 Personal history of nicotine dependence: Secondary | ICD-10-CM | POA: Insufficient documentation

## 2024-07-02 DIAGNOSIS — R109 Unspecified abdominal pain: Secondary | ICD-10-CM | POA: Diagnosis present

## 2024-07-02 LAB — URINALYSIS, W/ REFLEX TO CULTURE (INFECTION SUSPECTED)
Bacteria, UA: NONE SEEN
Glucose, UA: NEGATIVE mg/dL
Ketones, ur: NEGATIVE mg/dL
Nitrite: POSITIVE — AB
Protein, ur: 30 mg/dL — AB
Specific Gravity, Urine: 1.013 (ref 1.005–1.030)
WBC, UA: >50 WBC/hpf (ref 0–5)
pH: 5.5 (ref 5.0–8.0)

## 2024-07-02 LAB — CBC WITH DIFFERENTIAL/PLATELET
Abs Immature Granulocytes: 0.04 K/uL (ref 0.00–0.07)
Basophils Absolute: 0 K/uL (ref 0.0–0.1)
Basophils Relative: 0 %
Eosinophils Absolute: 0 K/uL (ref 0.0–0.5)
Eosinophils Relative: 0 %
HCT: 36.7 % (ref 36.0–46.0)
Hemoglobin: 11.8 g/dL — ABNORMAL LOW (ref 12.0–15.0)
Immature Granulocytes: 0 %
Lymphocytes Relative: 11 %
Lymphs Abs: 1.1 K/uL (ref 0.7–4.0)
MCH: 27.8 pg (ref 26.0–34.0)
MCHC: 32.2 g/dL (ref 30.0–36.0)
MCV: 86.6 fL (ref 80.0–100.0)
Monocytes Absolute: 1.1 K/uL — ABNORMAL HIGH (ref 0.1–1.0)
Monocytes Relative: 12 %
Neutro Abs: 7.5 K/uL (ref 1.7–7.7)
Neutrophils Relative %: 77 %
Platelets: 215 K/uL (ref 150–400)
RBC: 4.24 MIL/uL (ref 3.87–5.11)
RDW: 12.7 % (ref 11.5–15.5)
WBC: 9.8 K/uL (ref 4.0–10.5)
nRBC: 0 % (ref 0.0–0.2)

## 2024-07-02 LAB — COMPREHENSIVE METABOLIC PANEL WITH GFR
ALT: 22 U/L (ref 0–44)
AST: 19 U/L (ref 15–41)
Albumin: 4 g/dL (ref 3.5–5.0)
Alkaline Phosphatase: 79 U/L (ref 38–126)
Anion gap: 13 (ref 5–15)
BUN: 5 mg/dL — ABNORMAL LOW (ref 6–20)
CO2: 22 mmol/L (ref 22–32)
Calcium: 9.2 mg/dL (ref 8.9–10.3)
Chloride: 99 mmol/L (ref 98–111)
Creatinine, Ser: 0.9 mg/dL (ref 0.44–1.00)
GFR, Estimated: >60 mL/min (ref >60)
Glucose, Bld: 146 mg/dL — ABNORMAL HIGH (ref 70–99)
Potassium: 3.6 mmol/L (ref 3.5–5.1)
Sodium: 135 mmol/L (ref 135–145)
Total Bilirubin: 0.3 mg/dL (ref 0.0–1.2)
Total Protein: 7.9 g/dL (ref 6.5–8.1)

## 2024-07-02 LAB — PREGNANCY, URINE: Preg Test, Ur: NEGATIVE

## 2024-07-02 LAB — LACTIC ACID, PLASMA: Lactic Acid, Venous: 1.1 mmol/L (ref 0.5–1.9)

## 2024-07-02 MED ORDER — IOHEXOL 300 MG/ML  SOLN
100.0000 mL | Freq: Once | INTRAMUSCULAR | Status: AC | PRN
  Administered 2024-07-02: 100 mL via INTRAVENOUS

## 2024-07-02 MED ORDER — CEFPODOXIME PROXETIL 200 MG PO TABS: 200.0000 mg | ORAL_TABLET | Freq: Two times a day (BID) | ORAL | 0 refills | Status: AC

## 2024-07-02 MED ORDER — ACETAMINOPHEN 325 MG PO TABS
650.0000 mg | ORAL_TABLET | Freq: Once | ORAL | Status: AC | PRN
  Administered 2024-07-02: 650 mg via ORAL
  Filled 2024-07-02: qty 2

## 2024-07-02 MED ORDER — SULFAMETHOXAZOLE-TRIMETHOPRIM 800-160 MG PO TABS: 1.0000 | ORAL_TABLET | Freq: Two times a day (BID) | ORAL | 0 refills | Status: AC

## 2024-07-02 MED ORDER — KETOROLAC TROMETHAMINE 15 MG/ML IJ SOLN
15.0000 mg | Freq: Once | INTRAMUSCULAR | Status: AC
  Administered 2024-07-02: 15 mg via INTRAVENOUS
  Filled 2024-07-02: qty 1

## 2024-07-02 MED ORDER — SODIUM CHLORIDE 0.9 % IV SOLN
1.0000 g | Freq: Once | INTRAVENOUS | Status: AC
  Administered 2024-07-02: 1 g via INTRAVENOUS
  Filled 2024-07-02: qty 10

## 2024-07-02 NOTE — ED Triage Notes (Signed)
 Pt POV from home c/o right flank pain, recent dx w/ UTI last week, was not given abx, has been taking OTC meds. Currently denies urinary s/s

## 2024-07-02 NOTE — ED Provider Notes (Signed)
 [place] EMERGENCY DEPARTMENT AT [facility] Provider Note   CSN: [id number] Arrival date & time: 07/02/24  1328     Patient presents with: Flank Pain   Wanda Porter is a 28 y.o. female.   Patient is a 28 year old who presents with right flank pain.  She said it started about a week ago.  It is really in her right upper back.  There are some radiation to her right upper abdomen.  She is having some urinary urgency.  Yesterday she started having some chills and was noted to be febrile here in the ED.  She has had some nausea but no vomiting.  No vaginal symptoms.  No change in her stools.  She said that she was at urgent care last week with the symptoms and was told that she had a UTI but was not prescribed antibiotics for this.  I do not see any notes in epic.  It may be an on epic facility.  She has been taking AZO without improvement in symptoms.       Prior to Admission medications   Medication Sig Start Date End Date Taking? Authorizing Provider  cefpodoxime  (VANTIN ) 200 MG tablet Take 1 tablet (200 mg total) by mouth 2 (two) times daily. 07/02/24  Yes Lenor Hollering, MD  acetaminophen  (TYLENOL ) 325 MG tablet Take 650 mg by mouth every 6 (six) hours as needed for mild pain (pain score 1-3). 07/01/21   [provider]  etonogestrel  (NEXPLANON ) 68 MG IMPL implant 1 each by Subdermal route once.    [provider]  ibuprofen  (ADVIL ) 600 MG tablet Take 600 mg by mouth every 6 (six) hours as needed for mild pain (pain score 1-3). 07/01/21   [provider]  medroxyPROGESTERone  (DEPO-PROVERA ) 150 MG/ML injection Inject 150 mg into the muscle every 3 (three) months.    [provider]  Multiple Vitamins-Minerals (WOMENS MULTIVITAMIN PO) Take by mouth.    [provider]  Probiotic Product (PROBIOTIC DAILY PO) Take 1 tablet by mouth daily at 6 (six) AM.    [provider]    Allergies: Patient has no known allergies.     Review of Systems  Constitutional:  Positive for chills and fever. Negative for diaphoresis and fatigue.  HENT:  Negative for congestion, rhinorrhea and sneezing.   Eyes: Negative.   Respiratory:  Negative for cough, chest tightness and shortness of breath.   Cardiovascular:  Negative for chest pain and leg swelling.  Gastrointestinal:  Positive for abdominal pain and nausea. Negative for blood in stool, diarrhea and vomiting.  Genitourinary:  Positive for flank pain, frequency and urgency. Negative for difficulty urinating and hematuria.  Musculoskeletal:  Positive for back pain. Negative for arthralgias.  Skin:  Negative for rash.  Neurological:  Negative for dizziness, speech difficulty, weakness, numbness and headaches.    Updated Vital Signs BP (!) 123/46   Pulse 81   Temp 98.9 F (37.2 C) (Oral)   Resp 18   Ht 5' (1.524 m)   Wt 59 kg   SpO2 100%   BMI 25.39 kg/m   Physical Exam Constitutional:      Appearance: She is well-developed.  HENT:     Head: Normocephalic and atraumatic.  Eyes:     Pupils: Pupils are equal, round, and reactive to light.  Cardiovascular:     Rate and Rhythm: Normal rate and regular rhythm.     Heart sounds: Normal heart sounds.  Pulmonary:     Effort: Pulmonary  effort is normal. No respiratory distress.     Breath sounds: Normal breath sounds. No wheezing or rales.  Chest:     Chest wall: No tenderness.  Abdominal:     General: Bowel sounds are normal.     Palpations: Abdomen is soft.     Tenderness: There is no abdominal tenderness. There is no guarding or rebound.     Comments: Positive right CVA tenderness  Musculoskeletal:        General: Normal range of motion.     Cervical back: Normal range of motion and neck supple.  Lymphadenopathy:     Cervical: No cervical adenopathy.  Skin:    General: Skin is warm and dry.     Findings: No rash.  Neurological:     Mental Status: She is alert and oriented to person, place, and time.      (all labs ordered are listed, but only abnormal results are displayed) Labs Reviewed  COMPREHENSIVE METABOLIC PANEL WITH GFR - Abnormal; Notable for the following components:      Result Value   Glucose, Bld 146 (*)    BUN 5 (*)    All other components within normal limits  CBC WITH DIFFERENTIAL/PLATELET - Abnormal; Notable for the following components:   Hemoglobin 11.8 (*)    Monocytes Absolute 1.1 (*)    All other components within normal limits  URINALYSIS, W/ REFLEX TO CULTURE (INFECTION SUSPECTED) - Abnormal; Notable for the following components:   Color, Urine ORANGE (*)    APPearance HAZY (*)    Hgb urine dipstick SMALL (*)    Bilirubin Urine SMALL (*)    Protein, ur 30 (*)    Nitrite POSITIVE (*)    Leukocytes,Ua LARGE (*)    All other components within normal limits  URINE CULTURE  CULTURE, BLOOD (ROUTINE X 2)  CULTURE, BLOOD (ROUTINE X 2)  LACTIC ACID, PLASMA  PREGNANCY, URINE    EKG: None  Radiology: CT ABDOMEN PELVIS W CONTRAST Result Date: 07/02/2024 CLINICAL DATA:  Abdominal pain, recent UTI. EXAM: CT ABDOMEN AND PELVIS WITH CONTRAST TECHNIQUE: Multidetector CT imaging of the abdomen and pelvis was performed using the standard protocol following bolus administration of intravenous contrast. RADIATION DOSE REDUCTION: This exam was performed according to the departmental dose-optimization program which includes automated exposure control, adjustment of the mA and/or kV according to patient size and/or use of iterative reconstruction technique. CONTRAST:  100mL OMNIPAQUE  IOHEXOL  300 MG/ML  SOLN COMPARISON:  None Available. FINDINGS: Lower chest: No acute abnormality. Hepatobiliary: Mild hepatomegaly measuring 19 cm in craniocaudal dimension. Query mild hepatic steatosis. No focal liver abnormality is seen. No gallstones, gallbladder wall thickening, or biliary dilatation. Pancreas: Unremarkable. No pancreatic ductal dilatation or surrounding inflammatory changes.  Spleen: Normal in size without focal abnormality. Adrenals/Urinary Tract: Multiple wedge-shaped cortical hypodensities in right kidney. No hydronephrosis or nephrolithiasis. Left kidney is unremarkable. Both adrenal glands are normal. Bladder is unremarkable. Stomach/Bowel: Stomach is within normal limits. Appendix appears normal. No evidence of bowel wall thickening, distention, or inflammatory changes. Vascular/Lymphatic: No significant vascular findings are present. No enlarged abdominal or pelvic lymph nodes. Reproductive: Uterus and bilateral adnexa are unremarkable. Other: No anterior abdominal wall hernia. Musculoskeletal: No suspicious osseous lesion. IMPRESSION: Multifocal wedge-shaped cortical hypoenhancement of the right kidney suggestive of acute pyelonephritis. Differentials include renal infarct and less likely multifocal neoplastic process. Correlate with urinalysis and clinical findings. Hepatomegaly with mild hepatic steatosis. Electronically Signed   By: Megan  Zare M.D.   On: 07/02/2024  16:29     Procedures   Medications Ordered in the ED  acetaminophen  (TYLENOL ) tablet 650 mg (650 mg Oral Given 07/02/24 1348)  cefTRIAXone  (ROCEPHIN ) 1 g in sodium chloride  0.9 % 100 mL IVPB (1 g Intravenous New Bag/Given 07/02/24 1615)  ketorolac  (TORADOL ) 15 MG/ML injection 15 mg (15 mg Intravenous Given 07/02/24 1612)  iohexol  (OMNIPAQUE ) 300 MG/ML solution 100 mL (100 mLs Intravenous Contrast Given 07/02/24 1603)                                    Medical Decision Making Amount and/or Complexity of Data Reviewed Labs: ordered.  Risk OTC drugs. Prescription drug management.   This patient presents to the ED for concern of flank pain, this involves an extensive number of treatment options, and is a complaint that carries with it a high risk of complications and morbidity.  I considered the following differential and admission for this acute, potentially life threatening condition.  The  differential diagnosis includes UTI, pyelonephritis, kidney stone, cholecystitis, appendicitis, ovarian torsion, tubo-ovarian abscess, STD  MDM:    Patient is a 28 year old who presents with right flank pain.  She pretty much has tenderness in her right CVA area, not much associated abdominal tenderness.  She is overall well-appearing.  She was febrile and tachycardic on arrival but this resolved after treatment of her fever.  She was also given IV fluids.  Her urine is consistent with infection.  Her symptoms are consistent with pyelonephritis.  CT scan is consistent with pyelonephritis.  She has had some nausea but no vomiting.  Other labs are not concerning for sepsis.  Her lactate is normal.  Her white count is normal.  Her kidney function is normal.  She was given a dose of IV Rocephin .  She is well-appearing and appears to be appropriate for outpatient treatment.  Blood cultures as well as urine culture was performed.  She was discharged home in good condition.  She is tolerated oral p.o. challenge.  She was advised to follow-up with her PCP.  Return precautions were given.  (Labs, imaging, consults)  Labs: I Ordered, and personally interpreted labs.  The pertinent results include: Normal white count, normal lactate  Imaging Studies ordered: I ordered imaging studies including CT abdomen pelvis I independently visualized and interpreted imaging. I agree with the radiologist interpretation  Additional history obtained from chart.  External records from outside source obtained and reviewed including PCP notes  Cardiac Monitoring: The patient was maintained on a cardiac monitor.  If on the cardiac monitor, I personally viewed and interpreted the cardiac monitored which showed an underlying rhythm of: Sinus rhythm  Reevaluation: After the interventions noted above, I reevaluated the patient and found that they have :improved  Social Determinants of Health:  former smoker  Disposition:  Discharged to home  Co morbidities that complicate the patient evaluation  Past Medical History:  Diagnosis Date   Irregular fetal heart rate 07/04/2019   Pre-eclampsia    Single spontaneous delivery 08/25/2019   Supervision of low-risk pregnancy 06/25/2019    Nursing Staff Provider Office Location [facility] [place]  Dating   LMP consistent with early US  Language  English Anatomy US    Normal Flu Vaccine   Genetic Screen  NIPS:   AFP:   First Screen:  Quad:   TDaP vaccine    Hgb A1C or  GTT Early  Third trimester  Rhogam  N/A   LAB RESULTS  Feeding Plan  Breast/Bottle Blood Type --/Positive/-- (03/08 0000)  Contraception  Depo Antibody Negative (02/06 0000) Cir     Medicines Meds ordered this encounter  Medications   acetaminophen  (TYLENOL ) tablet 650 mg   cefTRIAXone  (ROCEPHIN ) 1 g in sodium chloride  0.9 % 100 mL IVPB    Antibiotic Indication::   UTI   ketorolac  (TORADOL ) 15 MG/ML injection 15 mg   iohexol  (OMNIPAQUE ) 300 MG/ML solution 100 mL   cefpodoxime  (VANTIN ) 200 MG tablet    Sig: Take 1 tablet (200 mg total) by mouth 2 (two) times daily.    Dispense:  20 tablet    Refill:  0    I have reviewed the patients home medicines and have made adjustments as needed  Problem List / ED Course: Problem List Items Addressed This Visit   None Visit Diagnoses       Pyelonephritis    -  Primary   Relevant Medications   cefTRIAXone  (ROCEPHIN ) 1 g in sodium chloride  0.9 % 100 mL IVPB (Completed)   cefpodoxime  (VANTIN ) 200 MG tablet                Final diagnoses:  Pyelonephritis    ED Discharge Orders          Ordered    cefpodoxime  (VANTIN ) 200 MG tablet  2 times daily        07/02/24 1748               Lenor Hollering, MD 07/02/24 1752

## 2024-07-02 NOTE — Telephone Encounter (Signed)
 Patient's insurance would not cover the antibiotic, was changed to Bactrim 

## 2024-07-02 NOTE — ED Notes (Signed)
 RN reviewed discharge instructions with pt. Pt verbalized understanding and had no further questions. VSS upon discharge.

## 2024-07-04 LAB — URINE CULTURE: Culture: >=100000 — AB

## 2024-07-05 ENCOUNTER — Telehealth (HOSPITAL_BASED_OUTPATIENT_CLINIC_OR_DEPARTMENT_OTHER): Payer: Self-pay | Admitting: *Deleted

## 2024-07-05 NOTE — Telephone Encounter (Signed)
 Post ED Visit - Positive Culture Follow-up  Culture report reviewed by antimicrobial stewardship pharmacist: [facility] []  Rankin Dee, Pharm.D. []  Venetia Gully, Pharm.D., BCPS AQ-ID []  Garrel Crews, Pharm.D., BCPS []  Almarie Lunger, Pharm.D., BCPS []  [place], [address].D., BCPS, AAHIVP []  Rosaline Bihari, Pharm.D., BCPS, AAHIVP []  Vernell Meier, PharmD, BCPS []  Latanya Hint, PharmD, BCPS []  Donald Medley, PharmD, BCPS []  Rocky Bold, PharmD []  Dorothyann Alert, PharmD, BCPS [x]  Dorn Buttner, PharmD  [facility] []  Rosaline Edison, PharmD []  Romona Bliss, PharmD []  Dolphus Roller, PharmD []  Veva Seip, Rph []  Vernell Daunt) Leonce, PharmD []  Eva Allis, PharmD []  Rosaline Millet, PharmD []  Iantha Batch, PharmD []  Arvin Gauss, PharmD []  Wanda Hasting, PharmD []  Ronal Rav, PharmD []  Rocky Slade, PharmD []  Bard Jeans, PharmD   Positive urine culture Treated with Cefpodoxime  Proxetil, organism sensitive to the same and no further patient follow-up is required at this time.  Albino Alan Novak 07/05/2024, 1:48 PM

## 2024-07-07 LAB — CULTURE, BLOOD (ROUTINE X 2)
Culture: NO GROWTH
Special Requests: ADEQUATE

## 2024-09-16 ENCOUNTER — Other Ambulatory Visit: Payer: Self-pay

## 2024-09-16 ENCOUNTER — Other Ambulatory Visit (HOSPITAL_COMMUNITY)
Admission: RE | Admit: 2024-09-16 | Discharge: 2024-09-16 | Disposition: A | Discharge status: Home / Self Care | Source: Ambulatory Visit | Attending: Family Medicine | Admitting: Family Medicine

## 2024-09-16 ENCOUNTER — Ambulatory Visit (INDEPENDENT_AMBULATORY_CARE_PROVIDER_SITE_OTHER)

## 2024-09-16 VITALS — BP 121/85 | HR 72 | Ht 60.0 in | Wt 140.3 lb

## 2024-09-16 DIAGNOSIS — Z3202 Encounter for pregnancy test, result negative: Secondary | ICD-10-CM

## 2024-09-16 DIAGNOSIS — Z202 Contact with and (suspected) exposure to infections with a predominantly sexual mode of transmission: Secondary | ICD-10-CM

## 2024-09-16 DIAGNOSIS — Z3200 Encounter for pregnancy test, result unknown: Secondary | ICD-10-CM

## 2024-09-16 LAB — POCT PREGNANCY, URINE: Preg Test, Ur: NEGATIVE

## 2024-09-16 NOTE — Progress Notes (Signed)
 Wanda Porter is here today for STD/STI screening and UPT. UPT was negative. I explained to the patient how to obtain a self-swab and she verblazied understanding. The patient is wanting to be tested because of new partner. I explained to the patient we will contact her with any abnormal results. Self-swab obtained and blood work collected.   [place], RN

## 2024-09-17 LAB — CERVICOVAGINAL ANCILLARY ONLY
Bacterial Vaginitis (gardnerella): NEGATIVE
Candida Glabrata: NEGATIVE
Candida Vaginitis: POSITIVE — AB
Chlamydia: NEGATIVE
Comment: NEGATIVE
Comment: NEGATIVE
Comment: NEGATIVE
Comment: NEGATIVE
Comment: NEGATIVE
Comment: NORMAL
Neisseria Gonorrhea: NEGATIVE
Trichomonas: NEGATIVE

## 2024-09-17 LAB — HIV ANTIBODY (ROUTINE TESTING W REFLEX): HIV Screen 4th Generation wRfx: NONREACTIVE

## 2024-09-17 LAB — HEPATITIS C ANTIBODY: Hep C Virus Ab: NONREACTIVE

## 2024-09-17 LAB — RPR: RPR Ser Ql: NONREACTIVE

## 2024-09-17 LAB — HEPATITIS B SURFACE ANTIGEN: Hepatitis B Surface Ag: NEGATIVE

## 2024-09-18 ENCOUNTER — Ambulatory Visit: Payer: Self-pay | Admitting: Family Medicine

## 2024-09-18 MED ORDER — FLUCONAZOLE 150 MG PO TABS: 150.0000 mg | ORAL_TABLET | Freq: Once | ORAL | 0 refills | Status: AC
